# Patient Record
Sex: Female | Born: 1990 | Race: Black or African American | Hispanic: No | Marital: Married | Smoking: Never smoker
Health system: Southern US, Community
[De-identification: ages and names within clinical notes are randomized; demographics above are authoritative.]

---

## 2013-10-27 ENCOUNTER — Emergency Department (HOSPITAL_COMMUNITY)
Admission: EM | Admit: 2013-10-27 | Discharge: 2013-10-27 | Disposition: A | Discharge status: Home / Self Care | Payer: Managed Care, Other (non HMO) | Attending: Emergency Medicine | Admitting: Emergency Medicine

## 2013-10-27 ENCOUNTER — Emergency Department (HOSPITAL_COMMUNITY): Payer: Managed Care, Other (non HMO)

## 2013-10-27 ENCOUNTER — Encounter (HOSPITAL_COMMUNITY): Payer: Self-pay | Admitting: Emergency Medicine

## 2013-10-27 DIAGNOSIS — R109 Unspecified abdominal pain: Secondary | ICD-10-CM

## 2013-10-27 DIAGNOSIS — K5900 Constipation, unspecified: Secondary | ICD-10-CM | POA: Insufficient documentation

## 2013-10-27 DIAGNOSIS — Z79899 Other long term (current) drug therapy: Secondary | ICD-10-CM | POA: Insufficient documentation

## 2013-10-27 DIAGNOSIS — Z7982 Long term (current) use of aspirin: Secondary | ICD-10-CM | POA: Insufficient documentation

## 2013-10-27 DIAGNOSIS — K644 Residual hemorrhoidal skin tags: Secondary | ICD-10-CM | POA: Insufficient documentation

## 2013-10-27 DIAGNOSIS — Z3202 Encounter for pregnancy test, result negative: Secondary | ICD-10-CM | POA: Insufficient documentation

## 2013-10-27 LAB — CBC WITH DIFFERENTIAL/PLATELET
BASOS PCT: 0 % (ref 0–1)
Basophils Absolute: 0 10*3/uL (ref 0.0–0.1)
EOS ABS: 0.2 10*3/uL (ref 0.0–0.7)
Eosinophils Relative: 4 % (ref 0–5)
HCT: 37.2 % (ref 36.0–46.0)
Hemoglobin: 12.5 g/dL (ref 12.0–15.0)
LYMPHS ABS: 1.2 10*3/uL (ref 0.7–4.0)
Lymphocytes Relative: 27 % (ref 12–46)
MCH: 28.3 pg (ref 26.0–34.0)
MCHC: 33.6 g/dL (ref 30.0–36.0)
MCV: 84.2 fL (ref 78.0–100.0)
MONOS PCT: 8 % (ref 3–12)
Monocytes Absolute: 0.3 10*3/uL (ref 0.1–1.0)
NEUTROS ABS: 2.8 10*3/uL (ref 1.7–7.7)
NEUTROS PCT: 61 % (ref 43–77)
PLATELETS: 263 10*3/uL (ref 150–400)
RBC: 4.42 MIL/uL (ref 3.87–5.11)
RDW: 12.3 % (ref 11.5–15.5)
WBC: 4.6 10*3/uL (ref 4.0–10.5)

## 2013-10-27 LAB — POC URINE PREG, ED: Preg Test, Ur: NEGATIVE

## 2013-10-27 LAB — COMPREHENSIVE METABOLIC PANEL
ALK PHOS: 43 U/L (ref 39–117)
ALT: 13 U/L (ref 0–35)
AST: 16 U/L (ref 0–37)
Albumin: 3.4 g/dL — ABNORMAL LOW (ref 3.5–5.2)
BUN: 8 mg/dL (ref 6–23)
CHLORIDE: 102 meq/L (ref 96–112)
CO2: 26 mEq/L (ref 19–32)
Calcium: 8.9 mg/dL (ref 8.4–10.5)
Creatinine, Ser: 0.79 mg/dL (ref 0.50–1.10)
GFR calc Af Amer: >90 mL/min (ref >90)
GFR calc non Af Amer: >90 mL/min (ref >90)
Glucose, Bld: 101 mg/dL — ABNORMAL HIGH (ref 70–99)
POTASSIUM: 3.6 meq/L — AB (ref 3.7–5.3)
SODIUM: 138 meq/L (ref 137–147)
Total Protein: 7.5 g/dL (ref 6.0–8.3)

## 2013-10-27 LAB — URINALYSIS, ROUTINE W REFLEX MICROSCOPIC
BILIRUBIN URINE: NEGATIVE
Glucose, UA: NEGATIVE mg/dL
HGB URINE DIPSTICK: NEGATIVE
Ketones, ur: NEGATIVE mg/dL
Nitrite: NEGATIVE
PH: 7 (ref 5.0–8.0)
Protein, ur: NEGATIVE mg/dL
Specific Gravity, Urine: 1.026 (ref 1.005–1.030)
Urobilinogen, UA: 1 mg/dL (ref 0.0–1.0)

## 2013-10-27 LAB — URINE MICROSCOPIC-ADD ON

## 2013-10-27 LAB — LIPASE, BLOOD: Lipase: 24 U/L (ref 11–59)

## 2013-10-27 MED ORDER — FLEET ENEMA 7-19 GM/118ML RE ENEM: 1.0000 | ENEMA | Freq: Every day | RECTAL | Status: DC | PRN

## 2013-10-27 MED ORDER — KETOROLAC TROMETHAMINE 15 MG/ML IJ SOLN: 15.0000 mg | Freq: Once | INTRAMUSCULAR | Status: DC

## 2013-10-27 MED ORDER — HYDROCODONE-ACETAMINOPHEN 5-325 MG PO TABS
1.0000 | ORAL_TABLET | ORAL | Status: DC | PRN
Start: 2013-10-27 — End: 2013-11-16

## 2013-10-27 MED ORDER — KETOROLAC TROMETHAMINE 30 MG/ML IJ SOLN
30.0000 mg | Freq: Once | INTRAMUSCULAR | Status: AC
  Administered 2013-10-27: 30 mg via INTRAMUSCULAR
  Filled 2013-10-27: qty 1

## 2013-10-27 MED ORDER — POLYETHYLENE GLYCOL 3350 17 GM/SCOOP PO POWD: 17.0000 g | Freq: Two times a day (BID) | ORAL | Status: DC

## 2013-10-27 NOTE — ED Provider Notes (Signed)
CSN: [id number]     Arrival date & time 10/27/13  1237 History   First MD Initiated Contact with Patient 10/27/13 1458     Chief Complaint  Patient presents with  . Abdominal Pain     (Consider location/radiation/quality/duration/timing/severity/associated sxs/prior Treatment) HPI Comments: The patient is a 23 year old female, presenting with abdominal pain since 0500 today. The patient describes discomfort as sharp, constant. Worsened by movement. Relief by resting.  She reports associated constipation, last p.m. 2-3 days ago. She reports recent recurrent constipation over the past one month, with a bowel movement every 2-3 days. She denies BRBPR, pus. Denies abdominal surgeries. Denies urinary symptoms, abnormal vaginal discharge, or or abnormal vaginal bleeding. Patient's last menstrual period was 10/06/2013. No recent antibiotic use, travel, or change in diet.    Patient is a 23 y.o. female presenting with abdominal pain. The history is provided by the patient. No language interpreter was used.  Abdominal Pain Associated symptoms: constipation   Associated symptoms: no chills, no diarrhea, no dysuria, no fever, no hematuria, no nausea, no vaginal bleeding and no vaginal discharge     History reviewed. No pertinent past medical history. History reviewed. No pertinent past surgical history. No family history on file. History  Substance Use Topics  . Smoking status: Never Smoker   . Smokeless tobacco: Never Used  . Alcohol Use: No   OB History   Grav Para Term Preterm Abortions TAB SAB Ect Mult Living                 Review of Systems  Constitutional: Negative for fever and chills.  Gastrointestinal: Positive for abdominal pain and constipation. Negative for nausea, diarrhea, blood in stool and anal bleeding.  Genitourinary: Negative for dysuria, urgency, frequency, hematuria, vaginal bleeding and vaginal discharge.      Allergies  Peanut-containing drug products  Home  Medications   Prior to Admission medications   Medication Sig Start Date End Date Taking? Authorizing Provider  aspirin 325 MG tablet Take 325 mg by mouth every 6 (six) hours as needed for headache.   Yes Historical Provider, MD  BIOTIN PO Take 1 tablet by mouth daily.   Yes Historical Provider, MD  diphenhydrAMINE (ALLERGY RELIEF) 25 MG tablet Take 25 mg by mouth every 6 (six) hours as needed for allergies.   Yes Historical Provider, MD  Levonorgestrel-Ethinyl Estradiol (CAMRESE) 0.15-0.03 &0.01 MG tablet Take 1 tablet by mouth every morning.   Yes Historical Provider, MD   BP 105/73  Pulse 73  Temp(Src) 98.3 F (36.8 C) (Oral)  Resp 17  SpO2 100%  LMP 10/06/2013 Physical Exam  Nursing note and vitals reviewed. Constitutional: She is oriented to person, place, and time. She appears well-developed and well-nourished. No distress.  HENT:  Head: Normocephalic and atraumatic.  Eyes: EOM are normal. Pupils are equal, round, and reactive to light. No scleral icterus.  Neck: Neck supple.  Cardiovascular: Normal rate, regular rhythm and normal heart sounds.   No murmur heard. Pulmonary/Chest: Effort normal and breath sounds normal. She has no wheezes.  Abdominal: Soft. Normal appearance and bowel sounds are normal. She exhibits no distension. There is tenderness in the epigastric area. There is guarding. There is no rebound, no CVA tenderness and negative Murphy's sign.  Mild-Moderate epigastric tenderness to palpation. Mild RLQ tenderness  Genitourinary:  No stool in rectal vault, no obvious blood on glove, minimal amount of tan stool on glove. Mild tenderness, small external hemorrhoid. Chaperone present.  Musculoskeletal: Normal range of  motion. She exhibits no edema.  Neurological: She is alert and oriented to person, place, and time.  Skin: Skin is warm and dry. No rash noted.  Psychiatric: She has a normal mood and affect. Her behavior is normal.    ED Course  Procedures  (including critical care time) Labs Review Labs Reviewed  COMPREHENSIVE METABOLIC PANEL - Abnormal; Notable for the following:    Potassium 3.6 (*)    Glucose, Bld 101 (*)    Albumin 3.4 (*)    Total Bilirubin <0.2 (*)    All other components within normal limits  URINALYSIS, ROUTINE W REFLEX MICROSCOPIC - Abnormal; Notable for the following:    APPearance CLOUDY (*)    Leukocytes, UA SMALL (*)    All other components within normal limits  URINE MICROSCOPIC-ADD ON - Abnormal; Notable for the following:    Squamous Epithelial / LPF FEW (*)    Bacteria, UA FEW (*)    All other components within normal limits  CBC WITH DIFFERENTIAL  LIPASE, BLOOD  POC URINE PREG, ED    Imaging Review Dg Abd 2 Views  10/27/2013   CLINICAL DATA:  Umbilical abdominal pain, abdominal distention  EXAM: ABDOMEN - 2 VIEW  COMPARISON:  None.  FINDINGS: The bowel gas pattern is normal. There is no evidence of free air. No radio-opaque calculi or other significant radiographic abnormality is seen.  IMPRESSION: Negative.   Electronically Signed   By: Christiana PellantGretchen  Green M.D.   On: 10/27/2013 15:53     EKG Interpretation None      MDM   Final diagnoses:  Abdominal pain  Constipation   Patient with generalized abdominal pain, last bowel movement was several days ago. Likely constipation. Normal abdominal x-ray, no signs of obstruction. Rectal exam, negative for impaction. CBC without psychosis, CMP without concerning abnormality. Negative lipase. UA small leukocytosis, she bacteria, squamous likely contaminate. Epigastric discomfort questionable PUD, denies history will have the patient follow up with PCP and GI for further evaluation of her abdominal discomfort and constipation over the past month. Reeval patient resting comfortably in room reports symptom improvement.  Discussed lab results, imaging results, and treatment plan with the patient. Return precautions given. Reports understanding and no other  concerns at this time.  Patient is stable for discharge at this time.  . Meds given in ED:  Medications  ketorolac (TORADOL) 30 MG/ML injection 30 mg (30 mg Intramuscular Given 10/27/13 1600)    Discharge Medication List as of 10/27/2013  4:10 PM    START taking these medications   Details  HYDROcodone-acetaminophen (NORCO/VICODIN) 5-325 MG per tablet Take 1 tablet by mouth every 4 (four) hours as needed. Take with food and plenty of fluid, Starting 10/27/2013, Until Discontinued, Print    polyethylene glycol powder (GLYCOLAX/MIRALAX) powder Take 17 g by mouth 2 (two) times daily. Until daily soft stools  OTC, Starting 10/27/2013, Until Discontinued, Print    sodium phosphate (FLEET) 7-19 GM/118ML ENEM Place 133 mLs (1 enema total) rectally daily as needed for severe constipation., Starting 10/27/2013, Until Discontinued, Print          Clabe SealLauren M Rubee Vega, PA-C 10/29/13 [phone]

## 2013-10-27 NOTE — Discharge Instructions (Signed)
Call for a follow up appointment with a Family or Primary Care Provider.  Call for an appointment with a gastroenterologist for further evaluation of her ongoing constipation. Return if Symptoms worsen.   Take medication as prescribed.  Drink plenty fluids, increase your dietary fiber.

## 2013-10-27 NOTE — ED Notes (Signed)
Pt states that she woke up with abd pain around 5am went on to work hoping it would get better and her boss sent her home bc of the pain.  Pt denies n/v/d  Pt states last BM was 2 days ago and was hard and had to strain more than her normal. Pt also states that her abd is more distended than normal.

## 2013-10-31 NOTE — ED Provider Notes (Signed)
Medical screening examination/treatment/procedure(s) were performed by non-physician practitioner and as supervising physician I was immediately available for consultation/collaboration.   EKG Interpretation None       Vanda Waskey R. Dayami Taitt, MD 10/31/13 1457 

## 2013-11-12 ENCOUNTER — Emergency Department (HOSPITAL_COMMUNITY)
Admission: EM | Admit: 2013-11-12 | Discharge: 2013-11-12 | Discharge status: Against Medical Advice | Payer: Managed Care, Other (non HMO)

## 2013-11-12 NOTE — ED Notes (Signed)
Pt seen walking out of the emergency room lobby with family member

## 2013-11-12 NOTE — ED Notes (Signed)
Pt did not return to emergency room lobby

## 2013-11-16 ENCOUNTER — Encounter (HOSPITAL_COMMUNITY): Payer: Self-pay | Admitting: Emergency Medicine

## 2013-11-16 ENCOUNTER — Emergency Department (HOSPITAL_COMMUNITY)
Admission: EM | Admit: 2013-11-16 | Discharge: 2013-11-16 | Disposition: A | Discharge status: Home / Self Care | Payer: Managed Care, Other (non HMO) | Attending: Emergency Medicine | Admitting: Emergency Medicine

## 2013-11-16 DIAGNOSIS — Z3202 Encounter for pregnancy test, result negative: Secondary | ICD-10-CM | POA: Insufficient documentation

## 2013-11-16 DIAGNOSIS — Z79899 Other long term (current) drug therapy: Secondary | ICD-10-CM | POA: Insufficient documentation

## 2013-11-16 DIAGNOSIS — N898 Other specified noninflammatory disorders of vagina: Secondary | ICD-10-CM

## 2013-11-16 DIAGNOSIS — R109 Unspecified abdominal pain: Secondary | ICD-10-CM

## 2013-11-16 LAB — WET PREP, GENITAL
Trich, Wet Prep: NONE SEEN
Yeast Wet Prep HPF POC: NONE SEEN

## 2013-11-16 LAB — URINE MICROSCOPIC-ADD ON

## 2013-11-16 LAB — URINALYSIS, ROUTINE W REFLEX MICROSCOPIC
Bilirubin Urine: NEGATIVE
Glucose, UA: NEGATIVE mg/dL
Hgb urine dipstick: NEGATIVE
KETONES UR: NEGATIVE mg/dL
NITRITE: NEGATIVE
Protein, ur: NEGATIVE mg/dL
SPECIFIC GRAVITY, URINE: 1.024 (ref 1.005–1.030)
Urobilinogen, UA: 1 mg/dL (ref 0.0–1.0)
pH: 6.5 (ref 5.0–8.0)

## 2013-11-16 LAB — POC URINE PREG, ED: PREG TEST UR: NEGATIVE

## 2013-11-16 MED ORDER — METRONIDAZOLE 500 MG PO TABS: 500.0000 mg | ORAL_TABLET | Freq: Two times a day (BID) | ORAL | Status: DC

## 2013-11-16 NOTE — ED Provider Notes (Signed)
CSN: [id number]     Arrival date & time 11/16/13  1542 History   First MD Initiated Contact with Patient 11/16/13 1609     Chief Complaint  Patient presents with  . Flank Pain  . Vaginal Discharge     (Consider location/radiation/quality/duration/timing/severity/associated sxs/prior Treatment) HPI 23 year old female presents with left flank pain for over 1 week. She states it comes and goes. It is worse by walking and twisting. She does not recall any specific injuries. Her family stated that it could be a UTI she presented to the ER for evaluation. She denies any specific frequency, urgency, or dysuria. No hematuria. The pain seems to come on for about an hour and then go away. Denies any nausea or abdominal pain. She's never had pain like this before. Never had any prior history of kidney stones. She's also been having vaginal discharge with a mild odor over the last week as well. She currently rates the pain as about a 2/10.  History reviewed. No pertinent past medical history. History reviewed. No pertinent past surgical history. No family history on file. History  Substance Use Topics  . Smoking status: Never Smoker   . Smokeless tobacco: Never Used  . Alcohol Use: No   OB History   Grav Para Term Preterm Abortions TAB SAB Ect Mult Living                 Review of Systems  Constitutional: Negative for fever.  Respiratory: Negative for cough.   Gastrointestinal: Negative for nausea, vomiting, abdominal pain and diarrhea.  Genitourinary: Positive for flank pain and vaginal discharge. Negative for dysuria, hematuria, vaginal bleeding, difficulty urinating and menstrual problem.  Musculoskeletal: Negative for back pain.  All other systems reviewed and are negative.     Allergies  Peanut-containing drug products  Home Medications   Prior to Admission medications   Medication Sig Start Date End Date Taking? Authorizing Provider  BIOTIN PO Take 1 tablet by mouth daily.    Yes Historical Provider, MD  diphenhydrAMINE (ALLERGY RELIEF) 25 MG tablet Take 25 mg by mouth every 6 (six) hours as needed for allergies.   Yes Historical Provider, MD  Levonorgestrel-Ethinyl Estradiol (CAMRESE) 0.15-0.03 &0.01 MG tablet Take 1 tablet by mouth every morning.   Yes Historical Provider, MD   BP 119/70  Pulse 87  Temp(Src) 99.2 F (37.3 C) (Oral)  Resp 20  SpO2 97%  LMP 09/29/2013 Physical Exam  Nursing note and vitals reviewed. Constitutional: She is oriented to person, place, and time. She appears well-developed and well-nourished.  HENT:  Head: Normocephalic and atraumatic.  Right Ear: External ear normal.  Left Ear: External ear normal.  Nose: Nose normal.  Eyes: Right eye exhibits no discharge. Left eye exhibits no discharge.  Cardiovascular: Normal rate, regular rhythm and normal heart sounds.   Pulmonary/Chest: Effort normal and breath sounds normal.  Abdominal: Soft. She exhibits no distension. There is no tenderness. There is no CVA tenderness.    Genitourinary: Uterus is not enlarged and not tender. Cervix exhibits no motion tenderness and no friability. Right adnexum displays no mass. Left adnexum displays no mass. No tenderness around the vagina. Vaginal discharge found.  Neurological: She is alert and oriented to person, place, and time.  Skin: Skin is warm and dry.    ED Course  Procedures (including critical care time) Labs Review Labs Reviewed  WET PREP, GENITAL - Abnormal; Notable for the following:    Clue Cells Wet Prep HPF POC FEW (*)  WBC, Wet Prep HPF POC FEW (*)    All other components within normal limits  URINALYSIS, ROUTINE W REFLEX MICROSCOPIC - Abnormal; Notable for the following:    APPearance CLOUDY (*)    Leukocytes, UA SMALL (*)    All other components within normal limits  URINE MICROSCOPIC-ADD ON - Abnormal; Notable for the following:    Squamous Epithelial / LPF FEW (*)    All other components within normal limits    URINE CULTURE  GC/CHLAMYDIA PROBE AMP  POC URINE PREG, ED    Imaging Review No results found.   EKG Interpretation None      MDM   Final diagnoses:  Vaginal discharge  Left flank pain    No signs of urinary tract infection. The patient's flank pain is atypical she has point tenderness nearly one spot on her left flank. I have low suspicion for this being renal colic. She does have mild to moderate vaginal discharge with a few clue cells. This will cover with Flagyl to see if this improves her discharge and possibly or flank pain. We'll have her followup with her PCP and/or her gynecologist. At this time I have low suspicion for serious acute pathology causing her flank symptoms. Discussed return precautions and she is stable for discharge.    Audree CamelScott T Axcel Horsch, MD 11/16/13 [phone]

## 2013-11-16 NOTE — ED Notes (Signed)
Pt c/o L side flank pain for about a week. Pt denies dysuria. Pt also c/o vaginal discharge with odor for about a week and a half.

## 2013-11-17 LAB — GC/CHLAMYDIA PROBE AMP
CT Probe RNA: NEGATIVE
GC Probe RNA: NEGATIVE

## 2013-11-17 LAB — URINE CULTURE: Colony Count: 30000

## 2013-12-02 ENCOUNTER — Encounter (HOSPITAL_COMMUNITY): Payer: Self-pay | Admitting: Emergency Medicine

## 2013-12-02 ENCOUNTER — Emergency Department (HOSPITAL_COMMUNITY)
Admission: EM | Admit: 2013-12-02 | Discharge: 2013-12-02 | Disposition: A | Discharge status: Home / Self Care | Payer: Managed Care, Other (non HMO) | Attending: Emergency Medicine | Admitting: Emergency Medicine

## 2013-12-02 DIAGNOSIS — Z79899 Other long term (current) drug therapy: Secondary | ICD-10-CM | POA: Insufficient documentation

## 2013-12-02 DIAGNOSIS — N898 Other specified noninflammatory disorders of vagina: Secondary | ICD-10-CM | POA: Insufficient documentation

## 2013-12-02 DIAGNOSIS — Z792 Long term (current) use of antibiotics: Secondary | ICD-10-CM | POA: Insufficient documentation

## 2013-12-02 DIAGNOSIS — Z3202 Encounter for pregnancy test, result negative: Secondary | ICD-10-CM | POA: Insufficient documentation

## 2013-12-02 DIAGNOSIS — IMO0002 Reserved for concepts with insufficient information to code with codable children: Secondary | ICD-10-CM | POA: Insufficient documentation

## 2013-12-02 LAB — URINALYSIS, ROUTINE W REFLEX MICROSCOPIC
Bilirubin Urine: NEGATIVE
GLUCOSE, UA: NEGATIVE mg/dL
Hgb urine dipstick: NEGATIVE
Ketones, ur: NEGATIVE mg/dL
Nitrite: NEGATIVE
PROTEIN: NEGATIVE mg/dL
SPECIFIC GRAVITY, URINE: 1.023 (ref 1.005–1.030)
UROBILINOGEN UA: 0.2 mg/dL (ref 0.0–1.0)
pH: 7 (ref 5.0–8.0)

## 2013-12-02 LAB — URINE MICROSCOPIC-ADD ON

## 2013-12-02 LAB — HIV ANTIBODY (ROUTINE TESTING W REFLEX): HIV 1&2 Ab, 4th Generation: NONREACTIVE

## 2013-12-02 LAB — POC URINE PREG, ED: PREG TEST UR: NEGATIVE

## 2013-12-02 LAB — RPR

## 2013-12-02 MED ORDER — CLOTRIMAZOLE-BETAMETHASONE 1-0.05 % EX CREA: TOPICAL_CREAM | CUTANEOUS | Status: DC

## 2013-12-02 NOTE — ED Notes (Signed)
Pt states that on the outside of her vagina she has rash like irrtation that hurts for about a month. Pt states that she was treated for bacteria vaginosis while back.

## 2013-12-02 NOTE — ED Provider Notes (Signed)
CSN: [id number]     Arrival date & time 12/02/13  1108 History   First MD Initiated Contact with Patient 12/02/13 1213     Chief Complaint  Patient presents with  . vaginal irriation      (Consider location/radiation/quality/duration/timing/severity/associated sxs/prior Treatment) HPI Comments: Patient presents to the ER for evaluation of vaginal irritation. Patient reports that she has redness, swelling and irritation around the area asked to her vagina. She reports that she was seen a couple of weeks ago for discharge and flank pain, treated for bacterial vaginosis with improvement of her discharge.    History reviewed. No pertinent past medical history. History reviewed. No pertinent past surgical history. No family history on file. History  Substance Use Topics  . Smoking status: Never Smoker   . Smokeless tobacco: Never Used  . Alcohol Use: No   OB History   Grav Para Term Preterm Abortions TAB SAB Ect Mult Living                 Review of Systems  Genitourinary: Positive for vaginal discharge and vaginal pain.  All other systems reviewed and are negative.     Allergies  Peanut-containing drug products  Home Medications   Prior to Admission medications   Medication Sig Start Date End Date Taking? Authorizing Provider  diphenhydrAMINE (ALLERGY RELIEF) 25 MG tablet Take 25 mg by mouth every 6 (six) hours as needed for allergies.   Yes Historical Provider, MD  Levonorgestrel-Ethinyl Estradiol (CAMRESE) 0.15-0.03 &0.01 MG tablet Take 1 tablet by mouth every morning.   Yes Historical Provider, MD  clotrimazole-betamethasone (LOTRISONE) cream Apply to affected area 2 times daily prn 12/02/13   Gilda Creasehristopher J. Pollina, MD  metroNIDAZOLE (FLAGYL) 500 MG tablet Take 500 mg by mouth 2 (two) times daily.    Historical Provider, MD   BP 109/65  Pulse 67  Temp(Src) 98.8 F (37.1 C) (Oral)  Resp 16  SpO2 100%  LMP 09/29/2013 Physical Exam  Constitutional: She is oriented to  person, place, and time. She appears well-developed and well-nourished. No distress.  HENT:  Head: Normocephalic and atraumatic.  Right Ear: Hearing normal.  Left Ear: Hearing normal.  Nose: Nose normal.  Mouth/Throat: Oropharynx is clear and moist and mucous membranes are normal.  Eyes: Conjunctivae and EOM are normal. Pupils are equal, round, and reactive to light.  Neck: Normal range of motion. Neck supple.  Cardiovascular: Regular rhythm, S1 normal and S2 normal.  Exam reveals no gallop and no friction rub.   No murmur heard. Pulmonary/Chest: Effort normal and breath sounds normal. No respiratory distress. She exhibits no tenderness.  Abdominal: Soft. Normal appearance and bowel sounds are normal. There is no hepatosplenomegaly. There is no tenderness. There is no rebound, no guarding, no tenderness at McBurney's point and negative Murphy's sign. No hernia.  Genitourinary: Uterus normal.    Cervix exhibits no motion tenderness, no discharge and no friability. Right adnexum displays no mass and no tenderness. Left adnexum displays no mass and no tenderness.  Musculoskeletal: Normal range of motion.  Neurological: She is alert and oriented to person, place, and time. She has normal strength. No cranial nerve deficit or sensory deficit. Coordination normal. GCS eye subscore is 4. GCS verbal subscore is 5. GCS motor subscore is 6.  Skin: Skin is warm, dry and intact. No rash noted. No cyanosis.  Psychiatric: She has a normal mood and affect. Her speech is normal and behavior is normal. Thought content normal.    ED  Course  Procedures (including critical care time) Labs Review Labs Reviewed  URINALYSIS, ROUTINE W REFLEX MICROSCOPIC - Abnormal; Notable for the following:    Leukocytes, UA SMALL (*)    All other components within normal limits  URINE MICROSCOPIC-ADD ON - Abnormal; Notable for the following:    Bacteria, UA FEW (*)    All other components within normal limits    GC/CHLAMYDIA PROBE AMP  RPR  HIV ANTIBODY (ROUTINE TESTING)  POC URINE PREG, ED    Imaging Review No results found.   EKG Interpretation None      MDM   Final diagnoses:  Vaginal irritation   Presents to the ER for evaluation irritation on the outside portions of her vagina. There is mild erythema without any other noted lesions. Internal examination was normal. Will treat externally with Lotrisone, followup with OB/GYN.    Gilda Creasehristopher J. Pollina, MD 12/02/13 1339

## 2013-12-02 NOTE — Discharge Instructions (Signed)
Monilial Vaginitis  Vaginitis in a soreness, swelling and redness (inflammation) of the vagina and vulva. Monilial vaginitis is not a sexually transmitted infection.  CAUSES   Yeast vaginitis is caused by yeast (candida) that is normally found in your vagina. With a yeast infection, the candida has overgrown in number to a point that upsets the chemical balance.  SYMPTOMS   · White, thick vaginal discharge.  · Swelling, itching, redness and irritation of the vagina and possibly the lips of the vagina (vulva).  · Burning or painful urination.  · Painful intercourse.  DIAGNOSIS   Things that may contribute to monilial vaginitis are:  · Postmenopausal and virginal states.  · Pregnancy.  · Infections.  · Being tired, sick or stressed, especially if you had monilial vaginitis in the past.  · Diabetes. Good control will help lower the chance.  · Birth control pills.  · Tight fitting garments.  · Using bubble bath, feminine sprays, douches or deodorant tampons.  · Taking certain medications that kill germs (antibiotics).  · Sporadic recurrence can occur if you become ill.  TREATMENT   Your caregiver will give you medication.  · There are several kinds of anti monilial vaginal creams and suppositories specific for monilial vaginitis. For recurrent yeast infections, use a suppository or cream in the vagina 2 times a week, or as directed.  · Anti-monilial or steroid cream for the itching or irritation of the vulva may also be used. Get your caregiver's permission.  · Painting the vagina with methylene blue solution may help if the monilial cream does not work.  · Eating yogurt may help prevent monilial vaginitis.  HOME CARE INSTRUCTIONS   · Finish all medication as prescribed.  · Do not have sex until treatment is completed or after your caregiver tells you it is okay.  · Take warm sitz baths.  · Do not douche.  · Do not use tampons, especially scented ones.  · Wear cotton underwear.  · Avoid tight pants and panty  hose.  · Tell your sexual partner that you have a yeast infection. They should go to their caregiver if they have symptoms such as mild rash or itching.  · Your sexual partner should be treated as well if your infection is difficult to eliminate.  · Practice safer sex. Use condoms.  · Some vaginal medications cause latex condoms to fail. Vaginal medications that harm condoms are:  · Cleocin cream.  · Butoconazole (Femstat®).  · Terconazole (Terazol®) vaginal suppository.  · Miconazole (Monistat®) (may be purchased over the counter).  SEEK MEDICAL CARE IF:   · You have a temperature by mouth above 102° F (38.9° C).  · The infection is getting worse after 2 days of treatment.  · The infection is not getting better after 3 days of treatment.  · You develop blisters in or around your vagina.  · You develop vaginal bleeding, and it is not your menstrual period.  · You have pain when you urinate.  · You develop intestinal problems.  · You have pain with sexual intercourse.  Document Released: 03/15/2005 Document Revised: 08/28/2011 Document Reviewed: 11/27/2008  [employer]® Patient Information ©2014 [employer].

## 2013-12-03 LAB — GC/CHLAMYDIA PROBE AMP
CT Probe RNA: NEGATIVE
GC Probe RNA: NEGATIVE

## 2014-03-16 ENCOUNTER — Emergency Department (HOSPITAL_COMMUNITY)
Admission: EM | Admit: 2014-03-16 | Discharge: 2014-03-16 | Disposition: A | Discharge status: Home / Self Care | Payer: Managed Care, Other (non HMO) | Attending: Emergency Medicine | Admitting: Emergency Medicine

## 2014-03-16 ENCOUNTER — Encounter (HOSPITAL_COMMUNITY): Payer: Self-pay | Admitting: Emergency Medicine

## 2014-03-16 DIAGNOSIS — Z792 Long term (current) use of antibiotics: Secondary | ICD-10-CM | POA: Diagnosis not present

## 2014-03-16 DIAGNOSIS — IMO0002 Reserved for concepts with insufficient information to code with codable children: Secondary | ICD-10-CM | POA: Diagnosis not present

## 2014-03-16 DIAGNOSIS — J029 Acute pharyngitis, unspecified: Secondary | ICD-10-CM | POA: Diagnosis present

## 2014-03-16 DIAGNOSIS — J020 Streptococcal pharyngitis: Secondary | ICD-10-CM | POA: Insufficient documentation

## 2014-03-16 DIAGNOSIS — H6120 Impacted cerumen, unspecified ear: Secondary | ICD-10-CM | POA: Insufficient documentation

## 2014-03-16 LAB — RAPID STREP SCREEN (MED CTR MEBANE ONLY): STREPTOCOCCUS, GROUP A SCREEN (DIRECT): POSITIVE — AB

## 2014-03-16 MED ORDER — ACETAMINOPHEN-CODEINE #3 300-30 MG PO TABS: 1.0000 | ORAL_TABLET | Freq: Four times a day (QID) | ORAL | Status: DC | PRN

## 2014-03-16 MED ORDER — NAPROXEN 500 MG PO TABS: 500.0000 mg | ORAL_TABLET | Freq: Two times a day (BID) | ORAL | Status: DC | PRN

## 2014-03-16 MED ORDER — PENICILLIN G BENZATHINE 1200000 UNIT/2ML IM SUSP
1.2000 10*6.[IU] | Freq: Once | INTRAMUSCULAR | Status: AC
  Administered 2014-03-16: 1.2 10*6.[IU] via INTRAMUSCULAR
  Filled 2014-03-16: qty 2

## 2014-03-16 NOTE — ED Provider Notes (Signed)
CSN: [id number]     Arrival date & time 03/16/14  1539 History  This chart was scribed for non-physician practitioner, Allen Derry, PA-C,working with Mirian Mo, MD, by Karle Plumber, ED Scribe. This patient was seen in room WTR8/WTR8 and the patient's care was started at 5:35 PM.  Chief Complaint  Patient presents with  . Sore Throat   Patient is a 23 y.o. female presenting with pharyngitis. The history is provided by the patient. No language interpreter was used.  Sore Throat This is a new problem. The current episode started 2 days ago. The problem occurs constantly. The problem has not changed since onset.Associated symptoms include headaches. Pertinent negatives include no chest pain, no abdominal pain and no shortness of breath. The symptoms are aggravated by swallowing. She has tried heat for the symptoms. The treatment provided moderate relief.  Sore Throat This is a new problem. The current episode started 2 days ago. The problem occurs constantly. The problem has not changed since onset.Associated symptoms include congestion, headaches and a sore throat. Pertinent negatives include no abdominal pain, arthralgias, chest pain, chills, coughing, fever, myalgias, nausea, neck pain, numbness, rash, swollen glands, vertigo, vomiting or weakness. The symptoms are aggravated by swallowing. She has tried heat for the symptoms. The treatment provided moderate relief.   HPI Comments:  Julie Mason is a 23 y.o. female who presents to the Emergency Department complaining of a severe, sharp R sided sore throat that began two days ago. Pt states she feels a lump on the right side of her throat. She reports associated rhinorrhea and congestion-related mild intermittent HA. She reports opening her mouth and swallowing makes her pain worse. Hot liquids help alleviate the pain. She has not taken any medications for her symptoms. Pt states that she has had contact with someone at work that was  diagnosed with strep. She denies fever, chills, otalgia, drooling, inability to swallow, dental pain, CP, SOB, cough, abdominal pain, nausea, vomiting or myalgias. Pt does not have a PCP except for her OB/GYN.  History reviewed. No pertinent past medical history. History reviewed. No pertinent past surgical history. No family history on file. History  Substance Use Topics  . Smoking status: Never Smoker   . Smokeless tobacco: Never Used  . Alcohol Use: No   OB History   Grav Para Term Preterm Abortions TAB SAB Ect Mult Living                 Review of Systems  Constitutional: Negative for fever and chills.  HENT: Positive for congestion, rhinorrhea, sinus pressure and sore throat. Negative for dental problem, drooling, ear discharge, ear pain, nosebleeds, postnasal drip, sneezing, tinnitus and trouble swallowing.   Eyes: Negative for pain.  Respiratory: Negative for cough, shortness of breath and wheezing.   Cardiovascular: Negative for chest pain.  Gastrointestinal: Negative for nausea, vomiting and abdominal pain.  Musculoskeletal: Negative for arthralgias, myalgias and neck pain.  Skin: Negative for rash.  Neurological: Positive for headaches. Negative for dizziness, vertigo, weakness, light-headedness and numbness.   A complete 10 system review of systems was obtained and all systems are negative except as noted in the HPI and PMH.   Allergies  Peanut-containing drug products  Home Medications   Prior to Admission medications   Medication Sig Start Date End Date Taking? Authorizing Provider  clotrimazole-betamethasone (LOTRISONE) cream Apply to affected area 2 times daily prn 12/02/13   Gilda Crease, MD  diphenhydrAMINE (ALLERGY RELIEF) 25 MG tablet Take 25 mg  by mouth every 6 (six) hours as needed for allergies.    Historical Provider, MD  Levonorgestrel-Ethinyl Estradiol (CAMRESE) 0.15-0.03 &0.01 MG tablet Take 1 tablet by mouth every morning.    Historical  Provider, MD  metroNIDAZOLE (FLAGYL) 500 MG tablet Take 500 mg by mouth 2 (two) times daily.    Historical Provider, MD   Triage Vitals: BP 103/67  Pulse 72  Temp(Src) 98.5 F (36.9 C) (Oral)  Resp 16  SpO2 98% Physical Exam  Nursing note and vitals reviewed. Constitutional: She is oriented to person, place, and time. Vital signs are normal. She appears well-developed and well-nourished.  Non-toxic appearance. No distress.  Afebrile, nontoxic, well appearing  HENT:  Head: Normocephalic and atraumatic.  Right Ear: Hearing and external ear normal. No drainage or swelling.  Left Ear: Hearing and external ear normal. No drainage or swelling.  Nose: Mucosal edema and rhinorrhea present.  Mouth/Throat: Uvula is midline, oropharynx is clear and moist and mucous membranes are normal. No trismus in the jaw. Normal dentition. No uvula swelling or dental caries. No oropharyngeal exudate, posterior oropharyngeal edema, posterior oropharyngeal erythema or tonsillar abscesses.  Cerumen impaction bilaterally and TMs not visualized. Nose with mild rhinorrhea, mucosal edema and erythema Oropharynx clear and moist, no erythema or exudates, no PTA or tonsillar swelling, no trismus or uvular deviation/swelling. MMM. No drooling  Eyes: Conjunctivae and EOM are normal. Pupils are equal, round, and reactive to light. Right eye exhibits no discharge. Left eye exhibits no discharge.  Neck: Normal range of motion. Neck supple.  Cardiovascular: Normal rate, regular rhythm, normal heart sounds and intact distal pulses.  Exam reveals no gallop and no friction rub.   No murmur heard. Pulmonary/Chest: Effort normal and breath sounds normal. No respiratory distress. She has no wheezes. She has no rales.  CTAB in all lung fields  Abdominal: Soft. Normal appearance and bowel sounds are normal. She exhibits no distension. There is no tenderness. There is no rigidity, no rebound and no guarding.  Musculoskeletal: Normal  range of motion.  Lymphadenopathy:       Head (right side): No submandibular and no tonsillar adenopathy present.       Head (left side): No submandibular and no tonsillar adenopathy present.    She has no cervical adenopathy.  No head/neck LAD  Neurological: She is alert and oriented to person, place, and time.  Skin: Skin is warm, dry and intact. No rash noted.  Psychiatric: She has a normal mood and affect. Her behavior is normal.    ED Course  Procedures (including critical care time) DIAGNOSTIC STUDIES: Oxygen Saturation is 98% on RA, normal by my interpretation.   COORDINATION OF CARE: 5:42 PM- Will treat for strep throat with antibiotics. Will give injection of PCN prior to discharge. Pt verbalizes understanding and agrees to plan.  Medications  penicillin g benzathine (BICILLIN LA) 1200000 UNIT/2ML injection 1.2 Million Units (not administered)    Labs Review Labs Reviewed  RAPID STREP SCREEN - Abnormal; Notable for the following:    Streptococcus, Group A Screen (Direct) POSITIVE (*)    All other components within normal limits    Imaging Review No results found.   EKG Interpretation None      MDM   Final diagnoses:  Strep pharyngitis    23y/o female with sore throat, unfortunately strep test was obtained in triage and was positive, therefore although low CENTOR criteria, given recent exposure to strep and pt insistance, will treat pt with bicillin today. Discussed  that this could still be false positive, and could just be viral illness. Pt tolerating secretions well without drooling, no PTA noted. Discussed symptom control. Pain meds given. Will have her f/up with PCP in 1wk for ongoing management. I explained the diagnosis and have given explicit precautions to return to the ER including for any other new or worsening symptoms. The patient understands and accepts the medical plan as it's been dictated and I have answered their questions. Discharge instructions  concerning home care and prescriptions have been given. The patient is STABLE and is discharged to home in good condition.   I personally performed the services described in this documentation, which was scribed in my presence. The recorded information has been reviewed and is accurate.  BP 103/67  Pulse 72  Temp(Src) 98.5 F (36.9 C) (Oral)  Resp 16  SpO2 98%  Meds ordered this encounter  Medications  . penicillin g benzathine (BICILLIN LA) 1200000 UNIT/2ML injection 1.2 Million Units    Sig:     Order Specific Question:  Antibiotic Indication:    Answer:  Pharyngitis  . acetaminophen-codeine (TYLENOL #3) 300-30 MG per tablet    Sig: Take 1 tablet by mouth every 6 (six) hours as needed for moderate pain.    Dispense:  20 tablet    Refill:  0    Order Specific Question:  Supervising Provider    Answer:  Eber Hong D [[id number]]  . naproxen (NAPROSYN) 500 MG tablet    Sig: Take 1 tablet (500 mg total) by mouth 2 (two) times daily as needed for mild pain, moderate pain or headache (TAKE WITH MEALS.).    Dispense:  20 tablet    Refill:  0    Order Specific Question:  Supervising Provider    Answer:  Vida Roller [address] [place], PA-C 03/16/14 [phone]

## 2014-03-16 NOTE — ED Notes (Signed)
Pt c/o sore throat over the past couple of days. Pt c/o "feels a lump when i swallow".  Pt denies fever.  Pt's coworker had strep throat.

## 2014-03-16 NOTE — Discharge Instructions (Signed)
Continue to stay well-hydrated. Gargle warm salt water and spit it out. Continue to alternate between Tylenol #3 and Naprosyn for pain or fever but don't drive while taking Tylenol #3. May consider over-the-counter Benadryl or antihistamine allergy medication for additional relief. You've been treated for strep throat based on your positive strep test, but beware that your illness could be a virus and could take up to two weeks to fully resolve. Followup with your primary care doctor in 5-7 days for recheck of ongoing symptoms. Return to emergency department for emergent changing or worsening of symptoms.   Pharyngitis Pharyngitis is a sore throat (pharynx). There is redness, pain, and swelling of your throat. HOME CARE   Drink enough fluids to keep your pee (urine) clear or pale yellow.  Only take medicine as told by your doctor.  You may get sick again if you do not take medicine as told. Finish your medicines, even if you start to feel better.  Do not take aspirin.  Rest.  Rinse your mouth (gargle) with salt water ( tsp of salt per 1 qt of water) every 1-2 hours. This will help the pain.  If you are not at risk for choking, you can suck on hard candy or sore throat lozenges. GET HELP IF:  You have large, tender lumps on your neck.  You have a rash.  You cough up green, yellow-brown, or bloody spit. GET HELP RIGHT AWAY IF:   You have a stiff neck.  You drool or cannot swallow liquids.  You throw up (vomit) or are not able to keep medicine or liquids down.  You have very bad pain that does not go away with medicine.  You have problems breathing (not from a stuffy nose). MAKE SURE YOU:   Understand these instructions.  Will watch your condition.  Will get help right away if you are not doing well or get worse. Document Released: 11/22/2007 Document Revised: 03/26/2013 Document Reviewed: 02/10/2013 [facility] Patient Information 2015 [place], [place]. This information is  not intended to replace advice given to you by your health care provider. Make sure you discuss any questions you have with your health care provider.  Salt Water Gargle This solution will help make your mouth and throat feel better. HOME CARE INSTRUCTIONS   Mix 1 teaspoon of salt in 8 ounces of warm water.  Gargle with this solution as much or often as you need or as directed. Swish and gargle gently if you have any sores or wounds in your mouth.  Do not swallow this mixture. Document Released: 03/09/2004 Document Revised: 08/28/2011 Document Reviewed: 07/31/2008 [facility] Patient Information 2015 [place], [place]. This information is not intended to replace advice given to you by your health care provider. Make sure you discuss any questions you have with your health care provider.  Strep Throat Strep throat is an infection of the throat caused by a bacteria named Streptococcus pyogenes. Your health care provider may call the infection streptococcal "tonsillitis" or "pharyngitis" depending on whether there are signs of inflammation in the tonsils or back of the throat. Strep throat is most common in children aged 5-15 years during the cold months of the year, but it can occur in people of any age during any season. This infection is spread from person to person (contagious) through coughing, sneezing, or other close contact. SIGNS AND SYMPTOMS   Fever or chills.  Painful, swollen, red tonsils or throat.  Pain or difficulty when swallowing.  White or yellow spots on  the tonsils or throat.  Swollen, tender lymph nodes or "glands" of the neck or under the jaw.  Red rash all over the body (rare). DIAGNOSIS  Many different infections can cause the same symptoms. A test must be done to confirm the diagnosis so the right treatment can be given. A "rapid strep test" can help your health care provider make the diagnosis in a few minutes. If this test is not available, a light swab of the  infected area can be used for a throat culture test. If a throat culture test is done, results are usually available in a day or two. TREATMENT  Strep throat is treated with antibiotic medicine. HOME CARE INSTRUCTIONS   Gargle with 1 tsp of salt in 1 cup of warm water, 3-4 times per day or as needed for comfort.  Family members who also have a sore throat or fever should be tested for strep throat and treated with antibiotics if they have the strep infection.  Make sure everyone in your household washes their hands well.  Do not share food, drinking cups, or personal items that could cause the infection to spread to others.  You may need to eat a soft food diet until your sore throat gets better.  Drink enough water and fluids to keep your urine clear or pale yellow. This will help prevent dehydration.  Get plenty of rest.  Stay home from school, day care, or work until you have been on antibiotics for 24 hours.  Take medicines only as directed by your health care provider.  Take your antibiotic medicine as directed by your health care provider. Finish it even if you start to feel better. SEEK MEDICAL CARE IF:   The glands in your neck continue to enlarge.  You develop a rash, cough, or earache.  You cough up green, yellow-brown, or bloody sputum.  You have pain or discomfort not controlled by medicines.  Your problems seem to be getting worse rather than better.  You have a fever. SEEK IMMEDIATE MEDICAL CARE IF:   You develop any new symptoms such as vomiting, severe headache, stiff or painful neck, chest pain, shortness of breath, or trouble swallowing.  You develop severe throat pain, drooling, or changes in your voice.  You develop swelling of the neck, or the skin on the neck becomes red and tender.  You develop signs of dehydration, such as fatigue, dry mouth, and decreased urination.  You become increasingly sleepy, or you cannot wake up completely. MAKE SURE  YOU:  Understand these instructions.  Will watch your condition.  Will get help right away if you are not doing well or get worse. Document Released: 06/02/2000 Document Revised: 10/20/2013 Document Reviewed: 08/04/2010 [facility] Patient Information 2015 [place], [place]. This information is not intended to replace advice given to you by your health care provider. Make sure you discuss any questions you have with your health care provider.  Upper Respiratory Infection, Adult An upper respiratory infection (URI) is also sometimes known as the common cold. The upper respiratory tract includes the nose, sinuses, throat, trachea, and bronchi. Bronchi are the airways leading to the lungs. Most people improve within 1 week, but symptoms can last up to 2 weeks. A residual cough may last even longer.  CAUSES Many different viruses can infect the tissues lining the upper respiratory tract. The tissues become irritated and inflamed and often become very moist. Mucus production is also common. A cold is contagious. You can easily spread the virus  to others by oral contact. This includes kissing, sharing a glass, coughing, or sneezing. Touching your mouth or nose and then touching a surface, which is then touched by another person, can also spread the virus. SYMPTOMS  Symptoms typically develop 1 to 3 days after you come in contact with a cold virus. Symptoms vary from person to person. They may include:  Runny nose.  Sneezing.  Nasal congestion.  Sinus irritation.  Sore throat.  Loss of voice (laryngitis).  Cough.  Fatigue.  Muscle aches.  Loss of appetite.  Headache.  Low-grade fever. DIAGNOSIS  You might diagnose your own cold based on familiar symptoms, since most people get a cold 2 to 3 times a year. Your caregiver can confirm this based on your exam. Most importantly, your caregiver can check that your symptoms are not due to another disease such as strep throat, sinusitis,  pneumonia, asthma, or epiglottitis. Blood tests, throat tests, and X-rays are not necessary to diagnose a common cold, but they may sometimes be helpful in excluding other more serious diseases. Your caregiver will decide if any further tests are required. RISKS AND COMPLICATIONS  You may be at risk for a more severe case of the common cold if you smoke cigarettes, have chronic heart disease (such as heart failure) or lung disease (such as asthma), or if you have a weakened immune system. The very young and very old are also at risk for more serious infections. Bacterial sinusitis, middle ear infections, and bacterial pneumonia can complicate the common cold. The common cold can worsen asthma and chronic obstructive pulmonary disease (COPD). Sometimes, these complications can require emergency medical care and may be life-threatening. PREVENTION  The best way to protect against getting a cold is to practice good hygiene. Avoid oral or hand contact with people with cold symptoms. Wash your hands often if contact occurs. There is no clear evidence that vitamin C, vitamin E, echinacea, or exercise reduces the chance of developing a cold. However, it is always recommended to get plenty of rest and practice good nutrition. TREATMENT  Treatment is directed at relieving symptoms. There is no cure. Antibiotics are not effective, because the infection is caused by a virus, not by bacteria. Treatment may include:  Increased fluid intake. Sports drinks offer valuable electrolytes, sugars, and fluids.  Breathing heated mist or steam (vaporizer or shower).  Eating chicken soup or other clear broths, and maintaining good nutrition.  Getting plenty of rest.  Using gargles or lozenges for comfort.  Controlling fevers with ibuprofen or acetaminophen as directed by your caregiver.  Increasing usage of your inhaler if you have asthma. Zinc gel and zinc lozenges, taken in the first 24 hours of the common cold, can  shorten the duration and lessen the severity of symptoms. Pain medicines may help with fever, muscle aches, and throat pain. A variety of non-prescription medicines are available to treat congestion and runny nose. Your caregiver can make recommendations and may suggest nasal or lung inhalers for other symptoms.  HOME CARE INSTRUCTIONS   Only take over-the-counter or prescription medicines for pain, discomfort, or fever as directed by your caregiver.  Use a warm mist humidifier or inhale steam from a shower to increase air moisture. This may keep secretions moist and make it easier to breathe.  Drink enough water and fluids to keep your urine clear or pale yellow.  Rest as needed.  Return to work when your temperature has returned to normal or as your caregiver advises. You  may need to stay home longer to avoid infecting others. You can also use a face mask and careful hand washing to prevent spread of the virus. SEEK MEDICAL CARE IF:   After the first few days, you feel you are getting worse rather than better.  You need your caregiver's advice about medicines to control symptoms.  You develop chills, worsening shortness of breath, or brown or red sputum. These may be signs of pneumonia.  You develop yellow or brown nasal discharge or pain in the face, especially when you bend forward. These may be signs of sinusitis.  You develop a fever, swollen neck glands, pain with swallowing, or white areas in the back of your throat. These may be signs of strep throat. SEEK IMMEDIATE MEDICAL CARE IF:   You have a fever.  You develop severe or persistent headache, ear pain, sinus pain, or chest pain.  You develop wheezing, a prolonged cough, cough up blood, or have a change in your usual mucus (if you have chronic lung disease).  You develop sore muscles or a stiff neck. Document Released: 11/29/2000 Document Revised: 08/28/2011 Document Reviewed: 09/10/2013 [facility] Patient Information 2015  [place], [place]. This information is not intended to replace advice given to you by your health care provider. Make sure you discuss any questions you have with your health care provider.

## 2014-03-18 NOTE — ED Provider Notes (Signed)
Medical screening examination/treatment/procedure(s) were performed by non-physician practitioner and as supervising physician I was immediately available for consultation/collaboration.   EKG Interpretation None        Mirian MoMatthew Azalea Cedar, MD 03/18/14 1123

## 2014-04-17 ENCOUNTER — Encounter (HOSPITAL_COMMUNITY): Payer: Self-pay | Admitting: Emergency Medicine

## 2014-04-17 ENCOUNTER — Emergency Department (HOSPITAL_COMMUNITY)
Admission: EM | Admit: 2014-04-17 | Discharge: 2014-04-17 | Disposition: A | Discharge status: Home / Self Care | Payer: Managed Care, Other (non HMO) | Attending: Emergency Medicine | Admitting: Emergency Medicine

## 2014-04-17 DIAGNOSIS — J029 Acute pharyngitis, unspecified: Secondary | ICD-10-CM | POA: Diagnosis present

## 2014-04-17 DIAGNOSIS — Z79899 Other long term (current) drug therapy: Secondary | ICD-10-CM | POA: Diagnosis not present

## 2014-04-17 DIAGNOSIS — Z791 Long term (current) use of non-steroidal anti-inflammatories (NSAID): Secondary | ICD-10-CM | POA: Diagnosis not present

## 2014-04-17 MED ORDER — AMOXICILLIN-POT CLAVULANATE 875-125 MG PO TABS: 1.0000 | ORAL_TABLET | Freq: Two times a day (BID) | ORAL | Status: DC

## 2014-04-17 NOTE — ED Provider Notes (Signed)
Medical screening examination/treatment/procedure(s) were performed by non-physician practitioner and as supervising physician I was immediately available for consultation/collaboration.     Zakariyah Freimark, MD 04/17/14 1532 

## 2014-04-17 NOTE — ED Provider Notes (Signed)
CSN: [id number]     Arrival date & time 04/17/14  1108 History   First MD Initiated Contact with Patient 04/17/14 1138     Chief Complaint  Patient presents with  . Sore Throat     (Consider location/radiation/quality/duration/timing/severity/associated sxs/prior Treatment) Patient is a 23 y.o. female presenting with pharyngitis. The history is provided by the patient. No language interpreter was used.  Sore Throat This is a new problem. The current episode started today. The problem occurs constantly. The problem has been gradually worsening. Associated symptoms include a sore throat. Nothing aggravates the symptoms. She has tried nothing for the symptoms. The treatment provided moderate relief.  Pt complains of a sorethroat.   Pt reports she was treated for strep with a shot.  Now has the same again  History reviewed. No pertinent past medical history. History reviewed. No pertinent past surgical history. No family history on file. History  Substance Use Topics  . Smoking status: Never Smoker   . Smokeless tobacco: Never Used  . Alcohol Use: No   OB History   Grav Para Term Preterm Abortions TAB SAB Ect Mult Living                 Review of Systems  HENT: Positive for sore throat.   All other systems reviewed and are negative.     Allergies  Peanut-containing drug products  Home Medications   Prior to Admission medications   Medication Sig Start Date End Date Taking? Authorizing Provider  acetaminophen-codeine (TYLENOL #3) 300-30 MG per tablet Take 1 tablet by mouth every 6 (six) hours as needed for moderate pain. 03/16/14   Mercedes Strupp Camprubi-Soms, PA-C  amoxicillin-clavulanate (AUGMENTIN) 875-125 MG per tablet Take 1 tablet by mouth 2 (two) times daily. 04/17/14   Elson AreasLeslie K Sofia, PA-C  diphenhydrAMINE (ALLERGY RELIEF) 25 MG tablet Take 25 mg by mouth every 6 (six) hours as needed for allergies (allergy related headache).     Historical Provider, MD   Levonorgestrel-Ethinyl Estradiol (CAMRESE) 0.15-0.03 &0.01 MG tablet Take 1 tablet by mouth every morning.    Historical Provider, MD  naproxen (NAPROSYN) 500 MG tablet Take 1 tablet (500 mg total) by mouth 2 (two) times daily as needed for mild pain, moderate pain or headache (TAKE WITH MEALS.). 03/16/14   Mercedes Strupp Camprubi-Soms, PA-C   BP 107/60  Pulse 70  Temp(Src) 98.3 F (36.8 C) (Oral)  Resp 16  SpO2 100% Physical Exam  Vitals reviewed. Constitutional: She appears well-developed and well-nourished.  HENT:  Right Ear: External ear normal.  Mouth/Throat: Oropharyngeal exudate present.  Erythema throat  Eyes: Pupils are equal, round, and reactive to light.  Neck: Normal range of motion. Neck supple.  Cardiovascular: Normal rate and normal heart sounds.   Pulmonary/Chest: Effort normal and breath sounds normal.  Abdominal: Soft.  Musculoskeletal: Normal range of motion.  Neurological: She is alert.  Skin: Skin is warm.  Psychiatric: She has a normal mood and affect.    ED Course  Procedures (including critical care time) Labs Review Labs Reviewed - No data to display  Imaging Review No results found.   EKG Interpretation None      MDM   Final diagnoses:  Pharyngitis    Augmentin gargles    Elson AreasLeslie K Sofia, PA-C 04/17/14 1151

## 2014-04-17 NOTE — ED Notes (Signed)
Pt states that she was seen here and treated with antibiotic shot for strep throat couple weeks ago. Pt states that she having sore throat that feels similar but worse than last time. Pt states that she didn't change out her toothbrush like was told to on the Internet.

## 2014-04-17 NOTE — Discharge Instructions (Signed)

## 2014-07-29 ENCOUNTER — Emergency Department (HOSPITAL_COMMUNITY)
Admission: EM | Admit: 2014-07-29 | Discharge: 2014-07-29 | Disposition: A | Discharge status: Home / Self Care | Payer: Managed Care, Other (non HMO) | Attending: Emergency Medicine | Admitting: Emergency Medicine

## 2014-07-29 ENCOUNTER — Encounter (HOSPITAL_COMMUNITY): Payer: Self-pay | Admitting: Emergency Medicine

## 2014-07-29 ENCOUNTER — Emergency Department (HOSPITAL_COMMUNITY): Payer: Managed Care, Other (non HMO)

## 2014-07-29 DIAGNOSIS — Z3202 Encounter for pregnancy test, result negative: Secondary | ICD-10-CM | POA: Diagnosis not present

## 2014-07-29 DIAGNOSIS — R1013 Epigastric pain: Secondary | ICD-10-CM | POA: Insufficient documentation

## 2014-07-29 DIAGNOSIS — Z79899 Other long term (current) drug therapy: Secondary | ICD-10-CM | POA: Insufficient documentation

## 2014-07-29 DIAGNOSIS — R109 Unspecified abdominal pain: Secondary | ICD-10-CM | POA: Diagnosis present

## 2014-07-29 LAB — URINALYSIS, ROUTINE W REFLEX MICROSCOPIC
Bilirubin Urine: NEGATIVE
GLUCOSE, UA: NEGATIVE mg/dL
Ketones, ur: 15 mg/dL — AB
Nitrite: NEGATIVE
Protein, ur: NEGATIVE mg/dL
Specific Gravity, Urine: 1.026 (ref 1.005–1.030)
Urobilinogen, UA: 1 mg/dL (ref 0.0–1.0)
pH: 6 (ref 5.0–8.0)

## 2014-07-29 LAB — COMPREHENSIVE METABOLIC PANEL
ALK PHOS: 36 U/L — AB (ref 39–117)
ALT: 18 U/L (ref 0–35)
AST: 21 U/L (ref 0–37)
Albumin: 4 g/dL (ref 3.5–5.2)
Anion gap: 7 (ref 5–15)
BILIRUBIN TOTAL: 0.6 mg/dL (ref 0.3–1.2)
BUN: 10 mg/dL (ref 6–23)
CHLORIDE: 105 mmol/L (ref 96–112)
CO2: 24 mmol/L (ref 19–32)
Calcium: 9.1 mg/dL (ref 8.4–10.5)
Creatinine, Ser: 0.7 mg/dL (ref 0.50–1.10)
GFR calc non Af Amer: >90 mL/min (ref >90)
Glucose, Bld: 82 mg/dL (ref 70–99)
Potassium: 3.7 mmol/L (ref 3.5–5.1)
Sodium: 136 mmol/L (ref 135–145)
TOTAL PROTEIN: 7.8 g/dL (ref 6.0–8.3)

## 2014-07-29 LAB — CBC WITH DIFFERENTIAL/PLATELET
Basophils Absolute: 0 10*3/uL (ref 0.0–0.1)
Basophils Relative: 0 % (ref 0–1)
EOS ABS: 0 10*3/uL (ref 0.0–0.7)
EOS PCT: 0 % (ref 0–5)
HCT: 40.8 % (ref 36.0–46.0)
HEMOGLOBIN: 13.7 g/dL (ref 12.0–15.0)
LYMPHS ABS: 1.1 10*3/uL (ref 0.7–4.0)
Lymphocytes Relative: 19 % (ref 12–46)
MCH: 28.9 pg (ref 26.0–34.0)
MCHC: 33.6 g/dL (ref 30.0–36.0)
MCV: 86.1 fL (ref 78.0–100.0)
MONOS PCT: 4 % (ref 3–12)
Monocytes Absolute: 0.2 10*3/uL (ref 0.1–1.0)
Neutro Abs: 4.2 10*3/uL (ref 1.7–7.7)
Neutrophils Relative %: 77 % (ref 43–77)
Platelets: 252 10*3/uL (ref 150–400)
RBC: 4.74 MIL/uL (ref 3.87–5.11)
RDW: 12.4 % (ref 11.5–15.5)
WBC: 5.5 10*3/uL (ref 4.0–10.5)

## 2014-07-29 LAB — URINE MICROSCOPIC-ADD ON

## 2014-07-29 LAB — LIPASE, BLOOD: Lipase: 19 U/L (ref 11–59)

## 2014-07-29 LAB — POC URINE PREG, ED: PREG TEST UR: NEGATIVE

## 2014-07-29 MED ORDER — DICYCLOMINE HCL 20 MG PO TABS: 20.0000 mg | ORAL_TABLET | Freq: Two times a day (BID) | ORAL | Status: DC

## 2014-07-29 MED ORDER — FAMOTIDINE IN NACL 20-0.9 MG/50ML-% IV SOLN
20.0000 mg | Freq: Once | INTRAVENOUS | Status: AC
  Administered 2014-07-29: 20 mg via INTRAVENOUS
  Filled 2014-07-29: qty 50

## 2014-07-29 MED ORDER — SODIUM CHLORIDE 0.9 % IV BOLUS (SEPSIS)
1000.0000 mL | Freq: Once | INTRAVENOUS | Status: AC
  Administered 2014-07-29: 1000 mL via INTRAVENOUS

## 2014-07-29 MED ORDER — SUCRALFATE 1 G PO TABS: 1.0000 g | ORAL_TABLET | Freq: Three times a day (TID) | ORAL | Status: DC

## 2014-07-29 MED ORDER — ESOMEPRAZOLE MAGNESIUM 20 MG PO CPDR: 20.0000 mg | DELAYED_RELEASE_CAPSULE | Freq: Every day | ORAL | Status: DC

## 2014-07-29 MED ORDER — GI COCKTAIL ~~LOC~~
30.0000 mL | Freq: Once | ORAL | Status: AC
  Administered 2014-07-29: 30 mL via ORAL
  Filled 2014-07-29: qty 30

## 2014-07-29 MED ORDER — MORPHINE SULFATE 2 MG/ML IJ SOLN
2.0000 mg | Freq: Once | INTRAMUSCULAR | Status: AC
  Administered 2014-07-29: 2 mg via INTRAVENOUS
  Filled 2014-07-29: qty 1

## 2014-07-29 MED ORDER — ONDANSETRON HCL 4 MG/2ML IJ SOLN
4.0000 mg | Freq: Once | INTRAMUSCULAR | Status: AC
  Administered 2014-07-29: 4 mg via INTRAVENOUS
  Filled 2014-07-29: qty 2

## 2014-07-29 NOTE — ED Provider Notes (Signed)
CSN: [id number]     Arrival date & time 07/29/14  1707 History   First MD Initiated Contact with Patient 07/29/14 1855     Chief Complaint  Patient presents with  . Abdominal Pain     (Consider location/radiation/quality/duration/timing/severity/associated sxs/prior Treatment) HPI  24 y/o F presenting to the ED for evaluation of abdominal pain. She states that this morning after she ate breakfast she had acute onset of crampy non-radiating epigastric pain and nausea. The pain has been intermittent throughout the day since that time. She does have a history of gastric reflux but is not currently taking any medications. LMP was 2 weeks ago. Denies vomiting, diarrhea, chest pain, SOB, dysuria, hematuria.     History reviewed. No pertinent past medical history. History reviewed. No pertinent past surgical history. History reviewed. No pertinent family history. History  Substance Use Topics  . Smoking status: Never Smoker   . Smokeless tobacco: Never Used  . Alcohol Use: No   OB History    No data available     Review of Systems  Ten systems reviewed and are negative for acute change, except as noted in the HPI.    Allergies  Peanut-containing drug products  Home Medications   Prior to Admission medications   Medication Sig Start Date End Date Taking? Authorizing Provider  diphenhydrAMINE (ALLERGY RELIEF) 25 MG tablet Take 25 mg by mouth every 6 (six) hours as needed for allergies (allergy related headache).    Yes Historical Provider, MD  Levonorgestrel-Ethinyl Estradiol (CAMRESE) 0.15-0.03 &0.01 MG tablet Take 1 tablet by mouth every morning.   Yes Historical Provider, MD  acetaminophen-codeine (TYLENOL #3) 300-30 MG per tablet Take 1 tablet by mouth every 6 (six) hours as needed for moderate pain. Patient not taking: Reported on 07/29/2014 03/16/14   Patty Sermons Camprubi-Soms, PA-C  amoxicillin-clavulanate (AUGMENTIN) 875-125 MG per tablet Take 1 tablet by mouth 2 (two)  times daily. Patient not taking: Reported on 07/29/2014 04/17/14   Fransico Meadow, PA-C  dicyclomine (BENTYL) 20 MG tablet Take 1 tablet (20 mg total) by mouth 2 (two) times daily. 07/29/14   Margarita Mail, PA-C  esomeprazole (NEXIUM) 20 MG capsule Take 1 capsule (20 mg total) by mouth daily at 12 noon. 07/29/14   Margarita Mail, PA-C  naproxen (NAPROSYN) 500 MG tablet Take 1 tablet (500 mg total) by mouth 2 (two) times daily as needed for mild pain, moderate pain or headache (TAKE WITH MEALS.). Patient not taking: Reported on 07/29/2014 03/16/14   Patty Sermons Camprubi-Soms, PA-C  sucralfate (CARAFATE) 1 G tablet Take 1 tablet (1 g total) by mouth 4 (four) times daily -  with meals and at bedtime. 07/29/14   Tamelia Michalowski, PA-C   BP 109/66 mmHg  Pulse 60  Temp(Src) 98.4 F (36.9 C) (Oral)  Resp 18  SpO2 100%  LMP 07/26/2014 (Approximate) Physical Exam  Constitutional: She is oriented to person, place, and time. She appears well-developed and well-nourished. No distress.  HENT:  Head: Normocephalic and atraumatic.  Eyes: Conjunctivae are normal. No scleral icterus.  Neck: Normal range of motion.  Cardiovascular: Normal rate, regular rhythm and normal heart sounds.  Exam reveals no gallop and no friction rub.   No murmur heard. Pulmonary/Chest: Effort normal and breath sounds normal. No respiratory distress.  Abdominal: Soft. Bowel sounds are normal. She exhibits no distension and no mass. There is no tenderness. There is no guarding.  Neurological: She is alert and oriented to person, place, and time.  Skin:  Skin is warm and dry. She is not diaphoretic.   Marland Kitchen  ED Course  Procedures (including critical care time) Labs Review Labs Reviewed  COMPREHENSIVE METABOLIC PANEL - Abnormal; Notable for the following:    Alkaline Phosphatase 36 (*)    All other components within normal limits  URINALYSIS, ROUTINE W REFLEX MICROSCOPIC - Abnormal; Notable for the following:    APPearance CLOUDY  (*)    Hgb urine dipstick TRACE (*)    Ketones, ur 15 (*)    Leukocytes, UA SMALL (*)    All other components within normal limits  URINE MICROSCOPIC-ADD ON - Abnormal; Notable for the following:    Squamous Epithelial / LPF MANY (*)    All other components within normal limits  CBC WITH DIFFERENTIAL/PLATELET  LIPASE, BLOOD  POC URINE PREG, ED    Imaging Review No results found.   EKG Interpretation None      MDM   Final diagnoses:  Epigastric pain  Patient with c/o aprigastric pain. Labs show slightly elevated alk phos. Will obtain US to r/o acute cholecystitis. Will treat for gastritis with pepcid, GI cocktail.  Patient is nontoxic, nonseptic appearing, in no apparent distress.  Patient's pain and other symptoms adequately managed in emergency department.  Fluid bolus given.  Labs, imaging and vitals reviewed. No apparent abnormalities on [place] and labs WNL. Urine appears contaminated.  Patient does not meet the SIRS or Sepsis criteria.  On repeat exam patient does not have a surgical abdomen and there are no peritoneal signs.  No indication of appendicitis, bowel obstruction, bowel perforation, cholecystitis, diverticulitis,  Patient discharged home with symptomatic treatment and given strict instructions for follow-up with their primary care physician.  I have also discussed reasons to return immediately to the ER.  Patient expresses understanding and agrees with plan.     Margarita Mail, PA-C 08/01/14 2005  Wandra Arthurs, MD 08/01/14 [phone]

## 2014-07-29 NOTE — ED Notes (Signed)
Patient presents to ED with c/o abdominal pain.  Pt reports that she started having epigastric pain this morning; pain has been constant with nausea, no vomiting or diarrhea.

## 2014-07-29 NOTE — Discharge Instructions (Signed)
Abdominal Pain, Women °Abdominal (stomach, pelvic, or belly) pain can be caused by many things. It is important to tell your doctor: °· The location of the pain. °· Does it come and go or is it present all the time? °· Are there things that start the pain (eating certain foods, exercise)? °· Are there other symptoms associated with the pain (fever, nausea, vomiting, diarrhea)? °All of this is helpful to know when trying to find the cause of the pain. °CAUSES  °· Stomach: virus or bacteria infection, or ulcer. °· Intestine: appendicitis (inflamed appendix), regional ileitis (Crohn's disease), ulcerative colitis (inflamed colon), irritable bowel syndrome, diverticulitis (inflamed diverticulum of the colon), or cancer of the stomach or intestine. °· Gallbladder disease or stones in the gallbladder. °· Kidney disease, kidney stones, or infection. °· Pancreas infection or cancer. °· Fibromyalgia (pain disorder). °· Diseases of the female organs: °¨ Uterus: fibroid (non-cancerous) tumors or infection. °¨ Fallopian tubes: infection or tubal pregnancy. °¨ Ovary: cysts or tumors. °¨ Pelvic adhesions (scar tissue). °¨ Endometriosis (uterus lining tissue growing in the pelvis and on the pelvic organs). °¨ Pelvic congestion syndrome (female organs filling up with blood just before the menstrual period). °¨ Pain with the menstrual period. °¨ Pain with ovulation (producing an egg). °¨ Pain with an IUD (intrauterine device, birth control) in the uterus. °¨ Cancer of the female organs. °· Functional pain (pain not caused by a disease, may improve without treatment). °· Psychological pain. °· Depression. °DIAGNOSIS  °Your doctor will decide the seriousness of your pain by doing an examination. °· Blood tests. °· X-rays. °· Ultrasound. °· CT scan (computed tomography, special type of X-ray). °· MRI (magnetic resonance imaging). °· Cultures, for infection. °· Barium enema (dye inserted in the large intestine, to better view it with  X-rays). °· Colonoscopy (looking in intestine with a lighted tube). °· Laparoscopy (minor surgery, looking in abdomen with a lighted tube). °· Major abdominal exploratory surgery (looking in abdomen with a large incision). °TREATMENT  °The treatment will depend on the cause of the pain.  °· Many cases can be observed and treated at home. °· Over-the-counter medicines recommended by your caregiver. °· Prescription medicine. °· Antibiotics, for infection. °· Birth control pills, for painful periods or for ovulation pain. °· Hormone treatment, for endometriosis. °· Nerve blocking injections. °· Physical therapy. °· Antidepressants. °· Counseling with a psychologist or psychiatrist. °· Minor or major surgery. °HOME CARE INSTRUCTIONS  °· Do not take laxatives, unless directed by your caregiver. °· Take over-the-counter pain medicine only if ordered by your caregiver. Do not take aspirin because it can cause an upset stomach or bleeding. °· Try a clear liquid diet (broth or water) as ordered by your caregiver. Slowly move to a bland diet, as tolerated, if the pain is related to the stomach or intestine. °· Have a thermometer and take your temperature several times a day, and record it. °· Bed rest and sleep, if it helps the pain. °· Avoid sexual intercourse, if it causes pain. °· Avoid stressful situations. °· Keep your follow-up appointments and tests, as your caregiver orders. °· If the pain does not go away with medicine or surgery, you may try: °¨ Acupuncture. °¨ Relaxation exercises (yoga, meditation). °¨ Group therapy. °¨ Counseling. °SEEK MEDICAL CARE IF:  °· You notice certain foods cause stomach pain. °· Your home care treatment is not helping your pain. °· You need stronger pain medicine. °· You want your IUD removed. °· You feel faint or   lightheaded. °· You develop nausea and vomiting. °· You develop a rash. °· You are having side effects or an allergy to your medicine. °SEEK IMMEDIATE MEDICAL CARE IF:  °· Your  pain does not go away or gets worse. °· You have a fever. °· Your pain is felt only in portions of the abdomen. The right side could possibly be appendicitis. The left lower portion of the abdomen could be colitis or diverticulitis. °· You are passing blood in your stools (bright red or black tarry stools, with or without vomiting). °· You have blood in your urine. °· You develop chills, with or without a fever. °· You pass out. °MAKE SURE YOU:  °· Understand these instructions. °· Will watch your condition. °· Will get help right away if you are not doing well or get worse. °Document Released: 04/02/2007 Document Revised: 10/20/2013 Document Reviewed: 04/22/2009 °[employer]® Patient Information ©2015 [employer]. This information is not intended to replace advice given to you by your health care provider. Make sure you discuss any questions you have with your health care provider. ° °

## 2014-11-25 ENCOUNTER — Emergency Department (HOSPITAL_COMMUNITY)
Admission: EM | Admit: 2014-11-25 | Discharge: 2014-11-25 | Disposition: A | Discharge status: Home / Self Care | Payer: Managed Care, Other (non HMO) | Attending: Emergency Medicine | Admitting: Emergency Medicine

## 2014-11-25 ENCOUNTER — Encounter (HOSPITAL_COMMUNITY): Payer: Self-pay | Admitting: Emergency Medicine

## 2014-11-25 DIAGNOSIS — R05 Cough: Secondary | ICD-10-CM | POA: Diagnosis present

## 2014-11-25 DIAGNOSIS — J069 Acute upper respiratory infection, unspecified: Secondary | ICD-10-CM | POA: Diagnosis not present

## 2014-11-25 DIAGNOSIS — Z79899 Other long term (current) drug therapy: Secondary | ICD-10-CM | POA: Diagnosis not present

## 2014-11-25 MED ORDER — AZITHROMYCIN 250 MG PO TABS: 250.0000 mg | ORAL_TABLET | Freq: Every day | ORAL | Status: DC

## 2014-11-25 NOTE — Discharge Instructions (Signed)
Cough, Adult ° A cough is a reflex. It helps you clear your throat and airways. A cough can help heal your body. A cough can last 2 or 3 weeks (acute) or may last more than 8 weeks (chronic). Some common causes of a cough can include an infection, allergy, or a cold. °HOME CARE °· Only take medicine as told by your doctor. °· If given, take your medicines (antibiotics) as told. Finish them even if you start to feel better. °· Use a cold steam vaporizer or humidifier in your home. This can help loosen thick spit (secretions). °· Sleep so you are almost sitting up (semi-upright). Use pillows to do this. This helps reduce coughing. °· Rest as needed. °· Stop smoking if you smoke. °GET HELP RIGHT AWAY IF: °· You have yellowish-white fluid (pus) in your thick spit. °· Your cough gets worse. °· Your medicine does not reduce coughing, and you are losing sleep. °· You cough up blood. °· You have trouble breathing. °· Your pain gets worse and medicine does not help. °· You have a fever. °MAKE SURE YOU:  °· Understand these instructions. °· Will watch your condition. °· Will get help right away if you are not doing well or get worse. °Document Released: 02/16/2011 Document Revised: 10/20/2013 Document Reviewed: 02/16/2011 °[employer]® Patient Information ©2015 [employer]. This information is not intended to replace advice given to you by your health care provider. Make sure you discuss any questions you have with your health care provider. ° °Upper Respiratory Infection, Adult °An upper respiratory infection (URI) is also sometimes known as the common cold. The upper respiratory tract includes the nose, sinuses, throat, trachea, and bronchi. Bronchi are the airways leading to the lungs. Most people improve within 1 week, but symptoms can last up to 2 weeks. A residual cough may last even longer.  °CAUSES °Many different viruses can infect the tissues lining the upper respiratory tract. The tissues become irritated and  inflamed and often become very moist. Mucus production is also common. A cold is contagious. You can easily spread the virus to others by oral contact. This includes kissing, sharing a glass, coughing, or sneezing. Touching your mouth or nose and then touching a surface, which is then touched by another person, can also spread the virus. °SYMPTOMS  °Symptoms typically develop 1 to 3 days after you come in contact with a cold virus. Symptoms vary from person to person. They may include: °· Runny nose. °· Sneezing. °· Nasal congestion. °· Sinus irritation. °· Sore throat. °· Loss of voice (laryngitis). °· Cough. °· Fatigue. °· Muscle aches. °· Loss of appetite. °· Headache. °· Low-grade fever. °DIAGNOSIS  °You might diagnose your own cold based on familiar symptoms, since most people get a cold 2 to 3 times a year. Your caregiver can confirm this based on your exam. Most importantly, your caregiver can check that your symptoms are not due to another disease such as strep throat, sinusitis, pneumonia, asthma, or epiglottitis. Blood tests, throat tests, and X-rays are not necessary to diagnose a common cold, but they may sometimes be helpful in excluding other more serious diseases. Your caregiver will decide if any further tests are required. °RISKS AND COMPLICATIONS  °You may be at risk for a more severe case of the common cold if you smoke cigarettes, have chronic heart disease (such as heart failure) or lung disease (such as asthma), or if you have a weakened immune system. The very young and very old are   also at risk for more serious infections. Bacterial sinusitis, middle ear infections, and bacterial pneumonia can complicate the common cold. The common cold can worsen asthma and chronic obstructive pulmonary disease (COPD). Sometimes, these complications can require emergency medical care and may be life-threatening. °PREVENTION  °The best way to protect against getting a cold is to practice good hygiene. Avoid  oral or hand contact with people with cold symptoms. Wash your hands often if contact occurs. There is no clear evidence that vitamin C, vitamin E, echinacea, or exercise reduces the chance of developing a cold. However, it is always recommended to get plenty of rest and practice good nutrition. °TREATMENT  °Treatment is directed at relieving symptoms. There is no cure. Antibiotics are not effective, because the infection is caused by a virus, not by bacteria. Treatment may include: °· Increased fluid intake. Sports drinks offer valuable electrolytes, sugars, and fluids. °· Breathing heated mist or steam (vaporizer or shower). °· Eating chicken soup or other clear broths, and maintaining good nutrition. °· Getting plenty of rest. °· Using gargles or lozenges for comfort. °· Controlling fevers with ibuprofen or acetaminophen as directed by your caregiver. °· Increasing usage of your inhaler if you have asthma. °Zinc gel and zinc lozenges, taken in the first 24 hours of the common cold, can shorten the duration and lessen the severity of symptoms. Pain medicines may help with fever, muscle aches, and throat pain. A variety of non-prescription medicines are available to treat congestion and runny nose. Your caregiver can make recommendations and may suggest nasal or lung inhalers for other symptoms.  °HOME CARE INSTRUCTIONS  °· Only take over-the-counter or prescription medicines for pain, discomfort, or fever as directed by your caregiver. °· Use a warm mist humidifier or inhale steam from a shower to increase air moisture. This may keep secretions moist and make it easier to breathe. °· Drink enough water and fluids to keep your urine clear or pale yellow. °· Rest as needed. °· Return to work when your temperature has returned to normal or as your caregiver advises. You may need to stay home longer to avoid infecting others. You can also use a face mask and careful hand washing to prevent spread of the virus. °SEEK  MEDICAL CARE IF:  °· After the first few days, you feel you are getting worse rather than better. °· You need your caregiver's advice about medicines to control symptoms. °· You develop chills, worsening shortness of breath, or brown or red sputum. These may be signs of pneumonia. °· You develop yellow or brown nasal discharge or pain in the face, especially when you bend forward. These may be signs of sinusitis. °· You develop a fever, swollen neck glands, pain with swallowing, or white areas in the back of your throat. These may be signs of strep throat. °SEEK IMMEDIATE MEDICAL CARE IF:  °· You have a fever. °· You develop severe or persistent headache, ear pain, sinus pain, or chest pain. °· You develop wheezing, a prolonged cough, cough up blood, or have a change in your usual mucus (if you have chronic lung disease). °· You develop sore muscles or a stiff neck. °Document Released: 11/29/2000 Document Revised: 08/28/2011 Document Reviewed: 09/10/2013 °[employer]® Patient Information ©2015 [employer]. This information is not intended to replace advice given to you by your health care provider. Make sure you discuss any questions you have with your health care provider. ° °

## 2014-11-25 NOTE — ED Provider Notes (Signed)
CSN: [id number]     Arrival date & time 11/25/14  1909 History  This chart was scribed for non-physician practitioner, Elpidio Anis, working with Azalia Bilis, MD by Richarda Overlie, ED Scribe. This patient was seen in room WTR5/WTR5 and the patient's care was started at 8:12 PM.   Chief Complaint  Patient presents with  . Cough  . Generalized Body Aches   The history is provided by the patient. No language interpreter was used.   HPI Comments: Julie Mason is a 24 y.o. female who presents to the Emergency Department complaining of cough for the last week that worsened last night. She states that her cough became productive last night and she noticed some minimal blood in the phlegm. Pt reports associated generalized body aches, sore throat, rhinorrhea, congestion, HA and chills. Pt states she has taken robitussin without relief. She denies any hx of smoking. Pt reports no pertinent past medical history. She reports no known sick contacts. Pt reports no alleviating factors at this time.   History reviewed. No pertinent past medical history. History reviewed. No pertinent past surgical history. No family history on file. History  Substance Use Topics  . Smoking status: Never Smoker   . Smokeless tobacco: Never Used  . Alcohol Use: No   OB History    No data available     Review of Systems  Constitutional: Positive for fever and chills.  HENT: Positive for congestion, rhinorrhea and sore throat.   Respiratory: Positive for cough.   Gastrointestinal: Negative for nausea and vomiting.  Musculoskeletal: Positive for myalgias.  Neurological: Positive for headaches.   Allergies  Peanut-containing drug products  Home Medications   Prior to Admission medications   Medication Sig Start Date End Date Taking? Authorizing Provider  acetaminophen-codeine (TYLENOL #3) 300-30 MG per tablet Take 1 tablet by mouth every 6 (six) hours as needed for moderate pain. Patient not taking: Reported  on 07/29/2014 03/16/14   Mercedes Camprubi-Soms, PA-C  amoxicillin-clavulanate (AUGMENTIN) 875-125 MG per tablet Take 1 tablet by mouth 2 (two) times daily. Patient not taking: Reported on 07/29/2014 04/17/14   Elson Areas, PA-C  dicyclomine (BENTYL) 20 MG tablet Take 1 tablet (20 mg total) by mouth 2 (two) times daily. 07/29/14   Arthor Captain, PA-C  diphenhydrAMINE (ALLERGY RELIEF) 25 MG tablet Take 25 mg by mouth every 6 (six) hours as needed for allergies (allergy related headache).     Historical Provider, MD  esomeprazole (NEXIUM) 20 MG capsule Take 1 capsule (20 mg total) by mouth daily at 12 noon. 07/29/14   Arthor Captain, PA-C  Levonorgestrel-Ethinyl Estradiol (CAMRESE) 0.15-0.03 &0.01 MG tablet Take 1 tablet by mouth every morning.    Historical Provider, MD  naproxen (NAPROSYN) 500 MG tablet Take 1 tablet (500 mg total) by mouth 2 (two) times daily as needed for mild pain, moderate pain or headache (TAKE WITH MEALS.). Patient not taking: Reported on 07/29/2014 03/16/14   Mercedes Camprubi-Soms, PA-C  sucralfate (CARAFATE) 1 G tablet Take 1 tablet (1 g total) by mouth 4 (four) times daily -  with meals and at bedtime. 07/29/14   Abigail Harris, PA-C   BP 107/77 mmHg  Pulse 92  Temp(Src) 99 F (37.2 C) (Oral)  Resp 16  Wt 160 lb (72.576 kg)  SpO2 99%  LMP 09/25/2014   Physical Exam  Constitutional: She is oriented to person, place, and time. She appears well-developed and well-nourished.  HENT:  Head: Normocephalic and atraumatic.  Nose: Mucosal edema ( mild)  present.  Eyes: Right eye exhibits no discharge. Left eye exhibits no discharge.  Neck: Neck supple. No tracheal deviation present.  Cardiovascular: Normal rate, regular rhythm and normal heart sounds.   No murmur heard. Pulmonary/Chest: Effort normal and breath sounds normal. No respiratory distress. She has no wheezes. She has no rhonchi. She has no rales.  Abdominal: She exhibits no distension.  Neurological: She is alert  and oriented to person, place, and time.  Skin: Skin is warm and dry.  Psychiatric: She has a normal mood and affect. Her behavior is normal.  Nursing note and vitals reviewed.   ED Course  Procedures   DIAGNOSTIC STUDIES: Oxygen Saturation is 99% on RA, normal by my interpretation.    COORDINATION OF CARE: 8:15 PM Discussed treatment plan with pt at bedside and pt agreed to plan.   Labs Review Labs Reviewed - No data to display  Imaging Review No results found.   EKG Interpretation None      MDM   Final diagnoses:  None  1. URI  Patient is well appearing. Symptoms of cough for the past week, worsening now with productive cough, chills, myalgias. Will treat with abx, supportive care otherwise.   I personally performed the services described in this documentation, which was scribed in my presence. The recorded information has been reviewed and is accurate.       Elpidio AnisShari Annalyce Lanpher, PA-C 11/26/14 0448  Elwin MochaBlair Walden, MD 11/26/14 0630

## 2014-11-25 NOTE — ED Notes (Signed)
Pt c/o cough and generalized body aches since last night. Denies N/V. Pt c/o sore throat. C/o chills. Pt taking OTC Robitussin without relief. Denies chest pain or SOB. A&Ox4 and ambulatory.

## 2015-02-03 ENCOUNTER — Encounter (HOSPITAL_COMMUNITY): Payer: Self-pay | Admitting: *Deleted

## 2015-02-03 ENCOUNTER — Emergency Department (HOSPITAL_COMMUNITY)
Admission: EM | Admit: 2015-02-03 | Discharge: 2015-02-03 | Disposition: A | Discharge status: Home / Self Care | Payer: Managed Care, Other (non HMO) | Attending: Emergency Medicine | Admitting: Emergency Medicine

## 2015-02-03 DIAGNOSIS — H9201 Otalgia, right ear: Secondary | ICD-10-CM | POA: Diagnosis present

## 2015-02-03 DIAGNOSIS — R0981 Nasal congestion: Secondary | ICD-10-CM | POA: Insufficient documentation

## 2015-02-03 DIAGNOSIS — Z79818 Long term (current) use of other agents affecting estrogen receptors and estrogen levels: Secondary | ICD-10-CM | POA: Insufficient documentation

## 2015-02-03 NOTE — ED Provider Notes (Signed)
CSN: [id number]     Arrival date & time 02/03/15  1907 History  This chart was scribed for Earley Favor, NP, working with Laurence Spates, MD by Chestine Spore, ED Scribe. The patient was seen in room WTR5/WTR5 at 8:43 PM.     Chief Complaint  Patient presents with  . Otalgia      The history is provided by the patient. No language interpreter was used.    Julie Mason is a 24 y.o. female who presents to the Emergency Department complaining of right ear discomfort onset 2 days. Pt thinks that she may have a cerumen impaction and that she is having decreased hearing. Pt is having associated symptoms of decreased hearing, rhinorrhea, congestion, and sinus congestion. She notes that she has tried sweet oil without hydrogen peroxide with no relief of her symptoms. She denies right ear drainage, fever, chills, cough, SOB, and any other symptoms.    History reviewed. No pertinent past medical history. History reviewed. No pertinent past surgical history. History reviewed. No pertinent family history. Social History  Substance Use Topics  . Smoking status: Never Smoker   . Smokeless tobacco: Never Used  . Alcohol Use: Yes     Comment: rarely   OB History    No data available     Review of Systems  HENT: Positive for congestion, ear pain, hearing loss and rhinorrhea. Negative for ear discharge and postnasal drip.   Respiratory: Negative for cough and shortness of breath.   All other systems reviewed and are negative.     Allergies  Peanut-containing drug products  Home Medications   Prior to Admission medications   Medication Sig Start Date End Date Taking? Authorizing Provider  Levonorgestrel-Ethinyl Estradiol (CAMRESE) 0.15-0.03 &0.01 MG tablet Take 1 tablet by mouth every morning.   Yes Historical Provider, MD  pseudoephedrine (SUDAFED) 30 MG tablet Take 30 mg by mouth every 4 (four) hours as needed for congestion.   Yes Historical Provider, MD  acetaminophen-codeine  (TYLENOL #3) 300-30 MG per tablet Take 1 tablet by mouth every 6 (six) hours as needed for moderate pain. Patient not taking: Reported on 07/29/2014 03/16/14   Mercedes Camprubi-Soms, PA-C  amoxicillin-clavulanate (AUGMENTIN) 875-125 MG per tablet Take 1 tablet by mouth 2 (two) times daily. Patient not taking: Reported on 07/29/2014 04/17/14   Elson Areas, PA-C  azithromycin (ZITHROMAX) 250 MG tablet Take 1 tablet (250 mg total) by mouth daily. Take first 2 tablets together, then 1 every day until finished. Patient not taking: Reported on 02/03/2015 11/25/14   Elpidio Anis, PA-C  dicyclomine (BENTYL) 20 MG tablet Take 1 tablet (20 mg total) by mouth 2 (two) times daily. Patient not taking: Reported on 02/03/2015 07/29/14   Arthor Captain, PA-C  diphenhydrAMINE (ALLERGY RELIEF) 25 MG tablet Take 25 mg by mouth every 6 (six) hours as needed for allergies (allergy related headache).     Historical Provider, MD  esomeprazole (NEXIUM) 20 MG capsule Take 1 capsule (20 mg total) by mouth daily at 12 noon. Patient not taking: Reported on 02/03/2015 07/29/14   Arthor Captain, PA-C  naproxen (NAPROSYN) 500 MG tablet Take 1 tablet (500 mg total) by mouth 2 (two) times daily as needed for mild pain, moderate pain or headache (TAKE WITH MEALS.). Patient not taking: Reported on 07/29/2014 03/16/14   Mercedes Camprubi-Soms, PA-C  sucralfate (CARAFATE) 1 G tablet Take 1 tablet (1 g total) by mouth 4 (four) times daily -  with meals and at bedtime. Patient not  taking: Reported on 02/03/2015 07/29/14   Arthor Captain, PA-C   BP 107/70 mmHg  Pulse 68  Temp(Src) 98.4 F (36.9 C) (Oral)  Resp 16  Ht  (1.651 m)  SpO2 100%  LMP 12/04/2014 (Approximate) Physical Exam  Constitutional: She is oriented to person, place, and time. She appears well-developed and well-nourished. No distress.  HENT:  Head: Normocephalic and atraumatic.  Left Ear: Tympanic membrane and ear canal normal.  Mouth/Throat: Oropharynx is clear  and moist. No oropharyngeal exudate.  Fluid behind the right ear otherwise normal.  Eyes: EOM are normal. Pupils are equal, round, and reactive to light.  Neck: Neck supple. No tracheal deviation present.  Cardiovascular: Normal rate and intact distal pulses.   Pulmonary/Chest: Effort normal. No respiratory distress.  Musculoskeletal: Normal range of motion.  Neurological: She is alert and oriented to person, place, and time.  Skin: Skin is warm and dry.  Psychiatric: She has a normal mood and affect. Her behavior is normal.  Nursing note and vitals reviewed.   ED Course  Procedures (including critical care time) DIAGNOSTIC STUDIES: Oxygen Saturation is 100% on RA, nl by my interpretation.    COORDINATION OF CARE: 8:45 PM Discussed treatment plan with pt at bedside and pt agreed to plan.   Labs Review Labs Reviewed - No data to display  Imaging Review No results found. I have personally reviewed and evaluated these images and lab results as part of my medical decision-making.   EKG Interpretation None    ecommended that patient continue taking decongestion tablets a regular basis for the next several days  MDM   Final diagnoses:  Sinus congestion    I personally performed the services described in this documentation, which was scribed in my presence. The recorded information has been reviewed and is accurate.   Earley Favor, NP 02/03/15 2101  Laurence Spates, MD 02/04/15 [phone]

## 2015-02-03 NOTE — ED Notes (Signed)
Pt admits to decreased hearing and discomfort to rt ear x2 days, pt concerned she may have a cerumen impaction. Pt denies fever.

## 2015-02-03 NOTE — Discharge Instructions (Signed)
You've been given a list ofdecongestion tablets that he can take over-the-counter.  These will help with the congestion.  Please take these on a regular basis

## 2015-07-16 IMAGING — US US ABDOMEN COMPLETE
1 series · 14 of 25 positions shown · non-contrast
Comparison: None.

CLINICAL DATA: Epigastric pain and nausea.

EXAM:
ULTRASOUND ABDOMEN COMPLETE

[Series 1: us abdomen complete · 0.17mm/px · 14 of 74 slices shown]
[im 1/74]
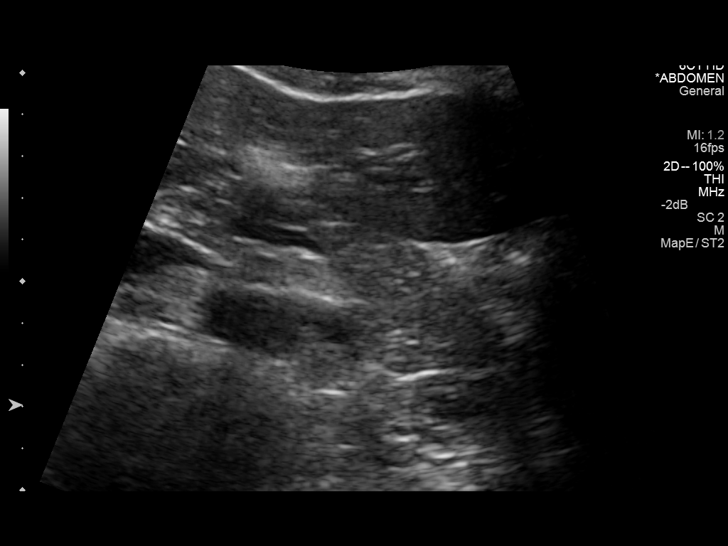
[im 7/74]
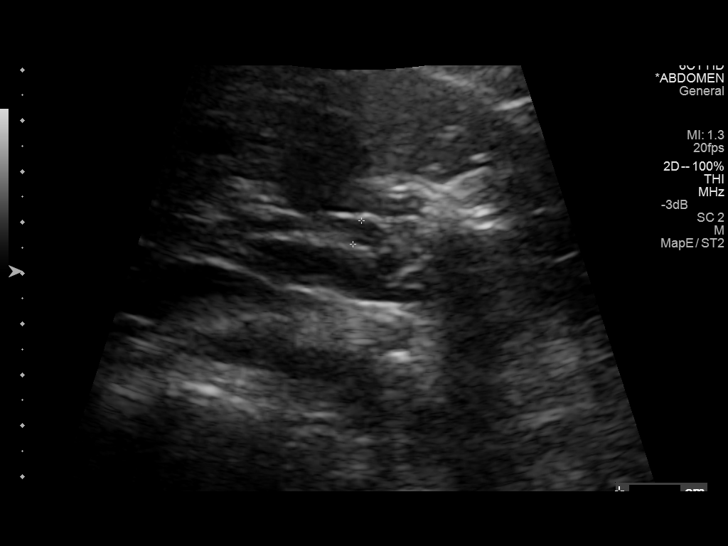
[im 13/74]
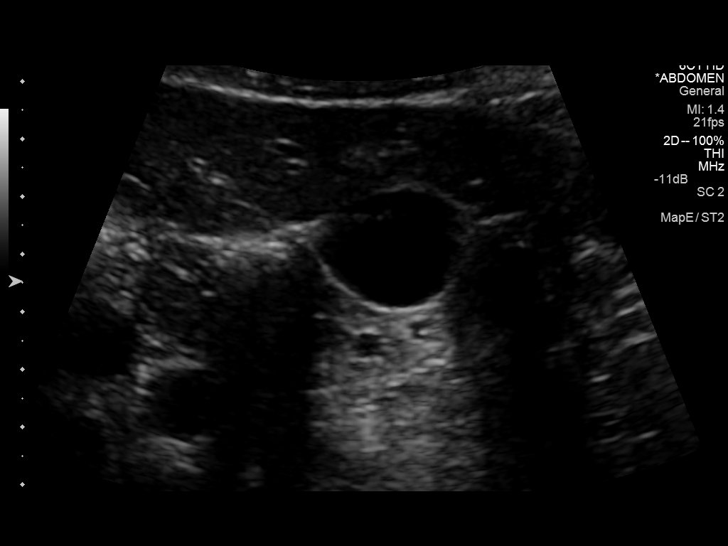
[im 19/74]
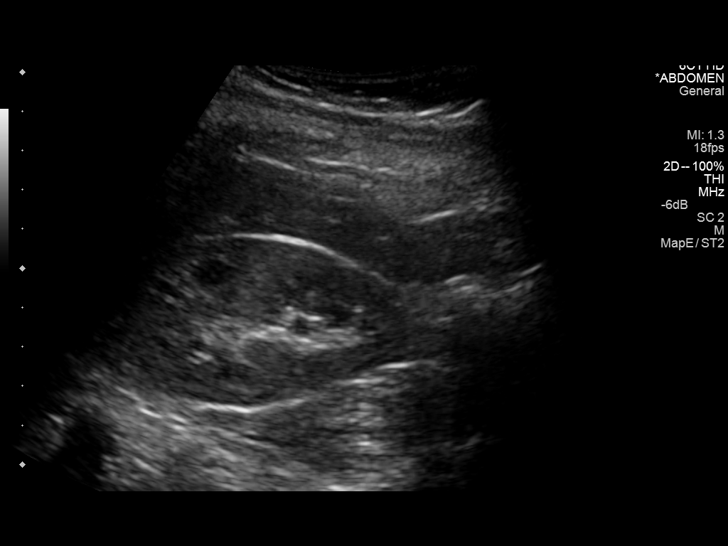
[im 25/74]
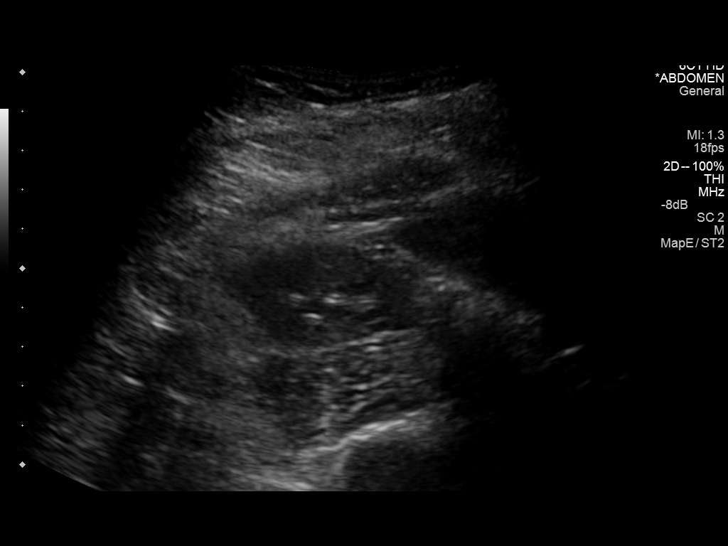
[im 28/74]
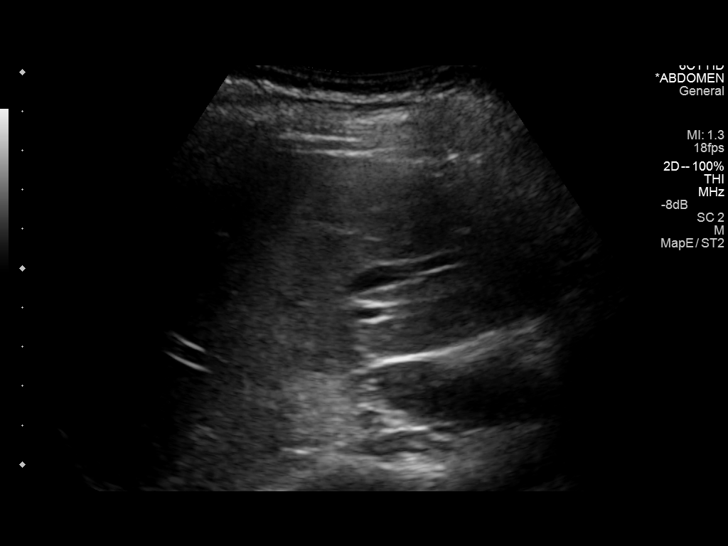
[im 34/74]
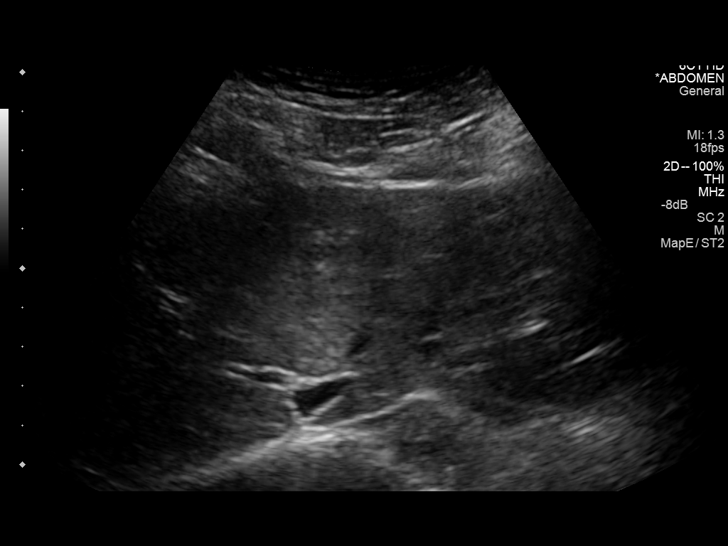
[im 40/74]
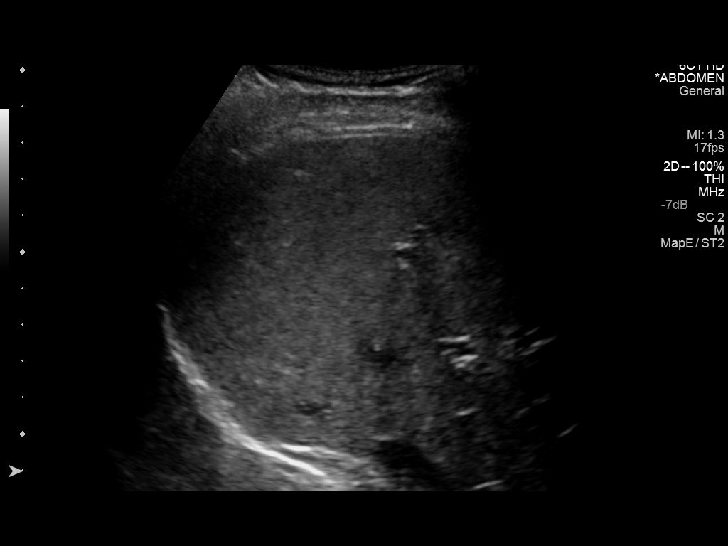
[im 46/74]
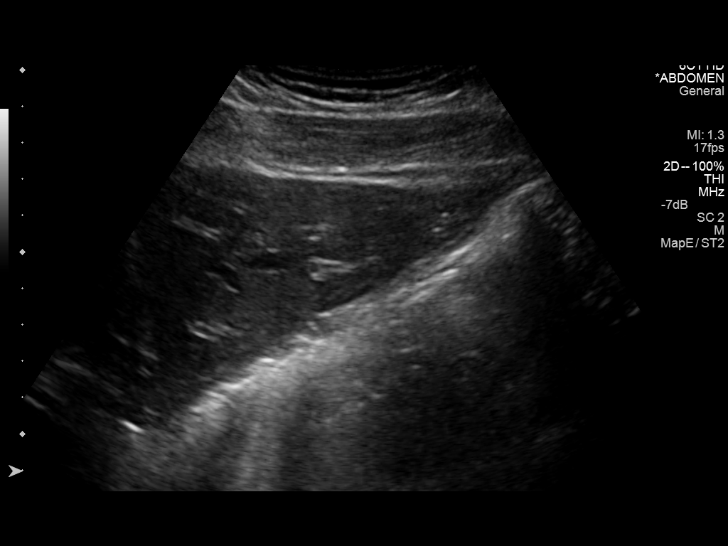
[im 49/74]
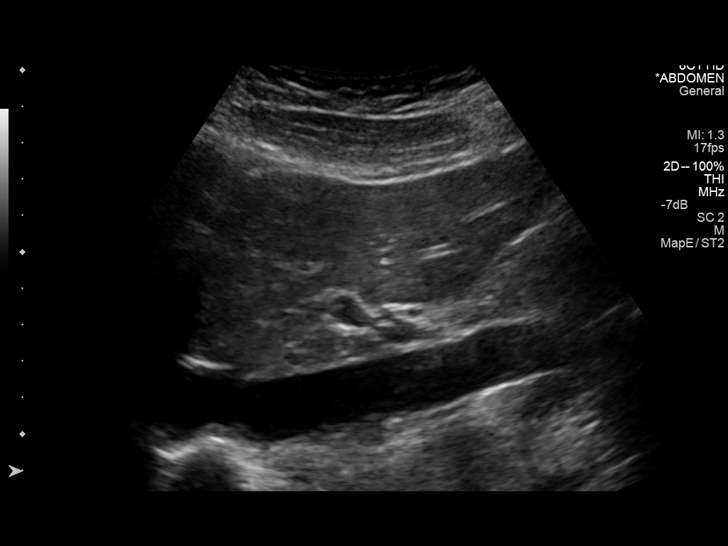
[im 55/74]
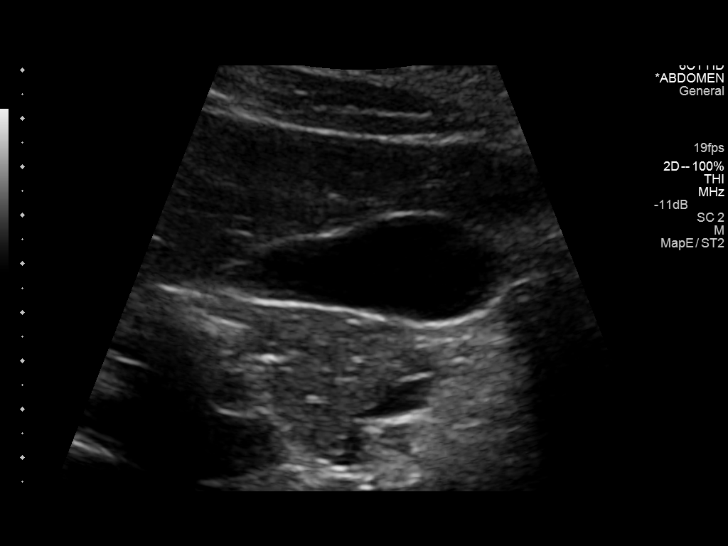
[im 61/74]
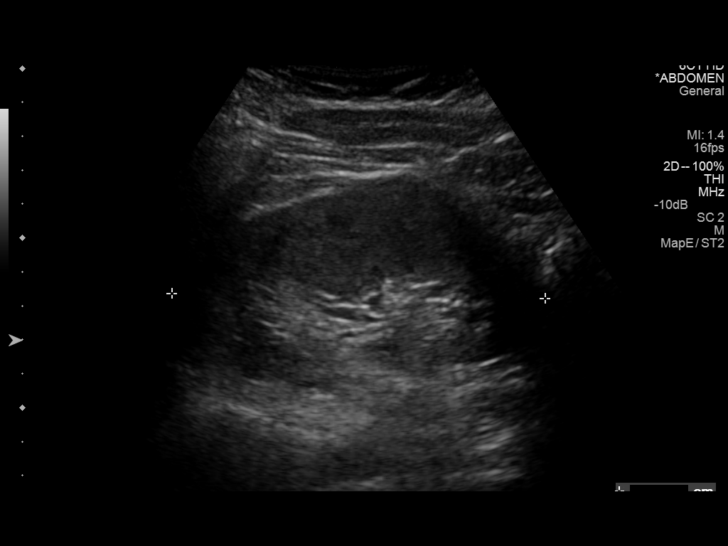
[im 67/74]
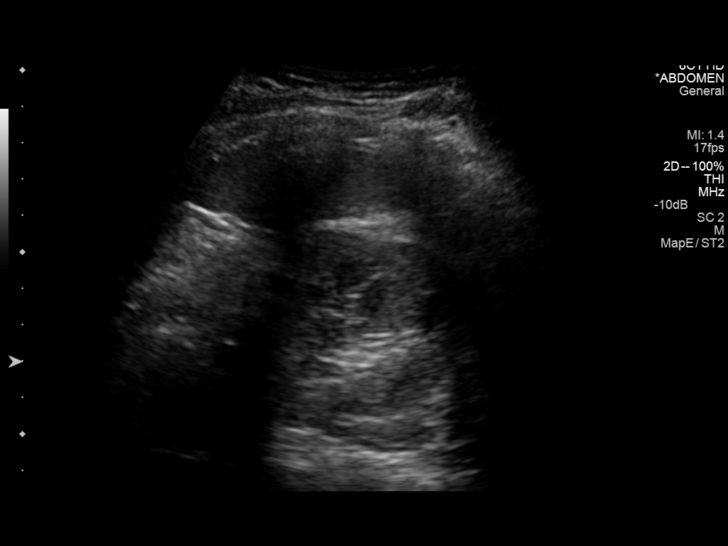
[im 74/74]
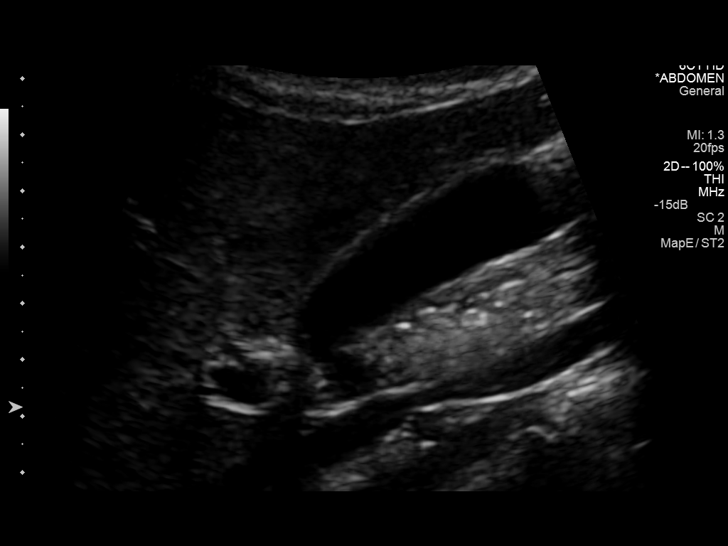

[14 of 25 positions shown; findings below may reference images not displayed]

FINDINGS: Gallbladder: No gallstones or wall thickening visualized. No
sonographic Murphy sign noted.

Common bile duct: Diameter: 4.9 mm, normal.

Liver: No focal lesion identified. Within normal limits in
parenchymal echogenicity.

IVC: No abnormality visualized.

Pancreas: Visualized portion unremarkable.

Spleen: 4.0 cm, normal.

Right Kidney: Length: 10.7 cm. Echogenicity within normal limits. No
mass or hydronephrosis visualized.

Left Kidney: Length: 11.0 cm. Echogenicity within normal limits. No
mass or hydronephrosis visualized.

Abdominal aorta: No aneurysm visualized.  2.0 cm maximal diameter.

Other findings: None.
IMPRESSION: Normal exam.

## 2015-10-24 ENCOUNTER — Encounter (HOSPITAL_COMMUNITY): Payer: Self-pay | Admitting: Emergency Medicine

## 2015-10-24 ENCOUNTER — Emergency Department (HOSPITAL_COMMUNITY)
Admission: EM | Admit: 2015-10-24 | Discharge: 2015-10-24 | Disposition: A | Discharge status: Home / Self Care | Payer: Managed Care, Other (non HMO) | Attending: Emergency Medicine | Admitting: Emergency Medicine

## 2015-10-24 DIAGNOSIS — Z791 Long term (current) use of non-steroidal anti-inflammatories (NSAID): Secondary | ICD-10-CM | POA: Insufficient documentation

## 2015-10-24 DIAGNOSIS — G43909 Migraine, unspecified, not intractable, without status migrainosus: Secondary | ICD-10-CM | POA: Diagnosis present

## 2015-10-24 DIAGNOSIS — Z79891 Long term (current) use of opiate analgesic: Secondary | ICD-10-CM | POA: Insufficient documentation

## 2015-10-24 DIAGNOSIS — G43809 Other migraine, not intractable, without status migrainosus: Secondary | ICD-10-CM

## 2015-10-24 DIAGNOSIS — Z79899 Other long term (current) drug therapy: Secondary | ICD-10-CM | POA: Insufficient documentation

## 2015-10-24 DIAGNOSIS — Z792 Long term (current) use of antibiotics: Secondary | ICD-10-CM | POA: Diagnosis not present

## 2015-10-24 DIAGNOSIS — Z793 Long term (current) use of hormonal contraceptives: Secondary | ICD-10-CM | POA: Diagnosis not present

## 2015-10-24 DIAGNOSIS — Z91010 Allergy to peanuts: Secondary | ICD-10-CM | POA: Insufficient documentation

## 2015-10-24 LAB — PREGNANCY, URINE: PREG TEST UR: NEGATIVE

## 2015-10-24 MED ORDER — BUTALBITAL-APAP-CAFFEINE 50-325-40 MG PO TABS: 1.0000 | ORAL_TABLET | Freq: Four times a day (QID) | ORAL | Status: AC | PRN

## 2015-10-24 MED ORDER — PROCHLORPERAZINE EDISYLATE 5 MG/ML IJ SOLN
10.0000 mg | Freq: Once | INTRAMUSCULAR | Status: AC
  Administered 2015-10-24: 10 mg via INTRAVENOUS
  Filled 2015-10-24: qty 2

## 2015-10-24 MED ORDER — KETOROLAC TROMETHAMINE 30 MG/ML IJ SOLN
30.0000 mg | Freq: Once | INTRAMUSCULAR | Status: AC
  Administered 2015-10-24: 30 mg via INTRAVENOUS
  Filled 2015-10-24: qty 1

## 2015-10-24 MED ORDER — DIPHENHYDRAMINE HCL 50 MG/ML IJ SOLN
25.0000 mg | Freq: Once | INTRAMUSCULAR | Status: AC
  Administered 2015-10-24: 25 mg via INTRAVENOUS
  Filled 2015-10-24: qty 1

## 2015-10-24 MED ORDER — SODIUM CHLORIDE 0.9 % IV BOLUS (SEPSIS)
1000.0000 mL | Freq: Once | INTRAVENOUS | Status: AC
  Administered 2015-10-24: 1000 mL via INTRAVENOUS

## 2015-10-24 MED ORDER — PROMETHAZINE HCL 25 MG PO TABS: 25.0000 mg | ORAL_TABLET | Freq: Four times a day (QID) | ORAL | Status: DC | PRN

## 2015-10-24 MED ORDER — BUTALBITAL-APAP-CAFFEINE 50-325-40 MG PO TABS
1.0000 | ORAL_TABLET | Freq: Once | ORAL | Status: AC
  Administered 2015-10-24: 1 via ORAL
  Filled 2015-10-24: qty 1

## 2015-10-24 NOTE — Discharge Instructions (Signed)
Return for worsening symptoms, including fever, vomiting   Migraine Headache A migraine headache is an intense, throbbing pain on one or both sides of your head. A migraine can last for 30 minutes to several hours. CAUSES  The exact cause of a migraine headache is not always known. However, a migraine may be caused when nerves in the brain become irritated and release chemicals that cause inflammation. This causes pain. Certain things may also trigger migraines, such as:  Alcohol.  Smoking.  Stress.  Menstruation.  Aged cheeses.  Foods or drinks that contain nitrates, glutamate, aspartame, or tyramine.  Lack of sleep.  Chocolate.  Caffeine.  Hunger.  Physical exertion.  Fatigue.  Medicines used to treat chest pain (nitroglycerine), birth control pills, estrogen, and some blood pressure medicines. SIGNS AND SYMPTOMS  Pain on one or both sides of your head.  Pulsating or throbbing pain.  Severe pain that prevents daily activities.  Pain that is aggravated by any physical activity.  Nausea, vomiting, or both.  Dizziness.  Pain with exposure to bright lights, loud noises, or activity.  General sensitivity to bright lights, loud noises, or smells. Before you get a migraine, you may get warning signs that a migraine is coming (aura). An aura may include:  Seeing flashing lights.  Seeing bright spots, halos, or zigzag lines.  Having tunnel vision or blurred vision.  Having feelings of numbness or tingling.  Having trouble talking.  Having muscle weakness. DIAGNOSIS  A migraine headache is often diagnosed based on:  Symptoms.  Physical exam.  A CT scan or MRI of your head. These imaging tests cannot diagnose migraines, but they can help rule out other causes of headaches. TREATMENT Medicines may be given for pain and nausea. Medicines can also be given to help prevent recurrent migraines.  HOME CARE INSTRUCTIONS  Only take over-the-counter or  prescription medicines for pain or discomfort as directed by your health care provider. The use of long-term narcotics is not recommended.  Lie down in a dark, quiet room when you have a migraine.  Keep a journal to find out what may trigger your migraine headaches. For example, write down:  What you eat and drink.  How much sleep you get.  Any change to your diet or medicines.  Limit alcohol consumption.  Quit smoking if you smoke.  Get 7-9 hours of sleep, or as recommended by your health care provider.  Limit stress.  Keep lights dim if bright lights bother you and make your migraines worse. SEEK IMMEDIATE MEDICAL CARE IF:   Your migraine becomes severe.  You have a fever.  You have a stiff neck.  You have vision loss.  You have muscular weakness or loss of muscle control.  You start losing your balance or have trouble walking.  You feel faint or pass out.  You have severe symptoms that are different from your first symptoms. MAKE SURE YOU:   Understand these instructions.  Will watch your condition.  Will get help right away if you are not doing well or get worse.   This information is not intended to replace advice given to you by your health care provider. Make sure you discuss any questions you have with your health care provider.   Document Released: 06/05/2005 Document Revised: 06/26/2014 Document Reviewed: 02/10/2013 Elsevier Interactive Patient Education [employer]2016 [employer]

## 2015-10-24 NOTE — ED Provider Notes (Signed)
CSN: [id number]     Arrival date & time 10/24/15  1221 History   First MD Initiated Contact with Patient 10/24/15 1256     Chief Complaint  Patient presents with  . Migraine  . Photophobia  . Emesis     (Consider location/radiation/quality/duration/timing/severity/associated sxs/prior Treatment) HPI 25 year old female who presents with migraine headache. States history of migraine headaches and near daily headaches Which is normal for her. States that 2 days ago she has had persistent headache. Initially started as her typical headache I frontal and associated with nausea. States that over the past 24 hours has gradually gotten worse and now primarily involves the right side of her face and behind her right eye. She has had nausea with intermittent vomiting, photophobia, and phonophobia. States that her allergies have been very severe recently which is a trigger for her migraine headaches.  History reviewed. No pertinent past medical history. History reviewed. No pertinent past surgical history. History reviewed. No pertinent family history. Social History  Substance Use Topics  . Smoking status: Never Smoker   . Smokeless tobacco: Never Used  . Alcohol Use: Yes     Comment: rarely   OB History    No data available     Review of Systems 10/14 systems reviewed and are negative other than those stated in the HPI   Allergies  Peanut-containing drug products  Home Medications   Prior to Admission medications   Medication Sig Start Date End Date Taking? Authorizing Provider  diphenhydrAMINE (ALLERGY RELIEF) 25 MG tablet Take 25 mg by mouth every 6 (six) hours as needed for allergies (allergy related headache).    Yes Historical Provider, MD  Levonorgestrel-Ethinyl Estradiol (CAMRESE) 0.15-0.03 &0.01 MG tablet Take 1 tablet by mouth every morning.   Yes Historical Provider, MD  Pseudoephedrine-Ibuprofen 30-200 MG TABS Take 1 tablet by mouth as needed (headache).   Yes Historical  Provider, MD  acetaminophen-codeine (TYLENOL #3) 300-30 MG per tablet Take 1 tablet by mouth every 6 (six) hours as needed for moderate pain. Patient not taking: Reported on 07/29/2014 03/16/14   Mercedes Camprubi-Soms, PA-C  amoxicillin-clavulanate (AUGMENTIN) 875-125 MG per tablet Take 1 tablet by mouth 2 (two) times daily. Patient not taking: Reported on 07/29/2014 04/17/14   Elson AreasLeslie K Sofia, PA-C  azithromycin (ZITHROMAX) 250 MG tablet Take 1 tablet (250 mg total) by mouth daily. Take first 2 tablets together, then 1 every day until finished. Patient not taking: Reported on 02/03/2015 11/25/14   Elpidio AnisShari Upstill, PA-C  butalbital-acetaminophen-caffeine (FIORICET) [phone] MG tablet Take 1-2 tablets by mouth every 6 (six) hours as needed for headache. 10/24/15 10/23/16  Lavera Guiseana Duo Shlok Raz, MD  dicyclomine (BENTYL) 20 MG tablet Take 1 tablet (20 mg total) by mouth 2 (two) times daily. Patient not taking: Reported on 02/03/2015 07/29/14   Arthor CaptainAbigail Harris, PA-C  esomeprazole (NEXIUM) 20 MG capsule Take 1 capsule (20 mg total) by mouth daily at 12 noon. Patient not taking: Reported on 02/03/2015 07/29/14   Arthor CaptainAbigail Harris, PA-C  naproxen (NAPROSYN) 500 MG tablet Take 1 tablet (500 mg total) by mouth 2 (two) times daily as needed for mild pain, moderate pain or headache (TAKE WITH MEALS.). Patient not taking: Reported on 07/29/2014 03/16/14   Mercedes Camprubi-Soms, PA-C  promethazine (PHENERGAN) 25 MG tablet Take 1 tablet (25 mg total) by mouth every 6 (six) hours as needed for nausea or vomiting. 10/24/15   Lavera Guiseana Duo Frederik Standley, MD  pseudoephedrine (SUDAFED) 30 MG tablet Take 30 mg by mouth  every 4 (four) hours as needed for congestion. Reported on 10/24/2015    Historical Provider, MD  sucralfate (CARAFATE) 1 G tablet Take 1 tablet (1 g total) by mouth 4 (four) times daily -  with meals and at bedtime. Patient not taking: Reported on 02/03/2015 07/29/14   Arthor Captain, PA-C   BP 104/77 mmHg  Pulse 63  Temp(Src) 98.6 F (37 C)  (Oral)  Resp 16  SpO2 94% Physical Exam Physical Exam  Nursing note and vitals reviewed. Constitutional: Well developed, well nourished, non-toxic, and in no acute distress Head: Normocephalic and atraumatic.  Mouth/Throat: Oropharynx is clear and moist.  Neck: Normal range of motion. Neck supple. No meningismus Cardiovascular: Normal rate and regular rhythm.   Pulmonary/Chest: Effort normal and breath sounds normal.  Abdominal: Soft. There is no tenderness. There is no rebound and no guarding.  Musculoskeletal: Normal range of motion.  Neurological: Alert, no facial droop, fluent speech, moves all extremities symmetrically, EOMI, PERRL, sensation to light touch in tact throughout Skin: Skin is warm and dry.  Psychiatric: Cooperative  ED Course  Procedures (including critical care time) Labs Review Labs Reviewed  PREGNANCY, URINE    Imaging Review No results found. I have personally reviewed and evaluated these images and lab results as part of my medical decision-making.   EKG Interpretation None      MDM   Final diagnoses:  Other migraine without status migrainosus, not intractable    25 year old female who presents with headache consistent with that of her migraine headaches. Presentation she is well-appearing in no distress. Her vital signs are normal. She has a normal neurological exam. No concerning features that would be concerning for infection, mass, or bleeding. Given migraine cocktail and feels symptomatically improved. States that she is improved for discharge home. Strict return and follow-up instructions reviewed. She expressed understanding of all discharge instructions and felt comfortable with the plan of care.    Lavera Guise, MD 10/24/15 [phone]

## 2015-10-24 NOTE — ED Notes (Signed)
Pt cannot use restroom at this time, aware urine specimen is needed.  

## 2015-10-24 NOTE — ED Notes (Signed)
MD at bedside. 

## 2015-10-24 NOTE — ED Notes (Signed)
Discharge instructions, follow up care, and rx x2 reviewed with patient. Patient verbalized understanding. 

## 2015-10-24 NOTE — ED Notes (Signed)
Hx of migraines. States this migraine is worse than it's ever been, states last night it made her vomit. Denies neck pain. Is c/o photophobia and sensativity to sound. Frontal headache.

## 2016-01-27 ENCOUNTER — Encounter (HOSPITAL_COMMUNITY): Payer: Self-pay | Admitting: *Deleted

## 2016-01-27 ENCOUNTER — Emergency Department (HOSPITAL_COMMUNITY)
Admission: EM | Admit: 2016-01-27 | Discharge: 2016-01-27 | Disposition: A | Discharge status: Home / Self Care | Payer: Managed Care, Other (non HMO) | Attending: Emergency Medicine | Admitting: Emergency Medicine

## 2016-01-27 DIAGNOSIS — Z79899 Other long term (current) drug therapy: Secondary | ICD-10-CM | POA: Insufficient documentation

## 2016-01-27 DIAGNOSIS — Z792 Long term (current) use of antibiotics: Secondary | ICD-10-CM | POA: Diagnosis not present

## 2016-01-27 DIAGNOSIS — K0889 Other specified disorders of teeth and supporting structures: Secondary | ICD-10-CM | POA: Insufficient documentation

## 2016-01-27 DIAGNOSIS — Z91010 Allergy to peanuts: Secondary | ICD-10-CM | POA: Insufficient documentation

## 2016-01-27 MED ORDER — PENICILLIN V POTASSIUM 500 MG PO TABS: 500.0000 mg | ORAL_TABLET | Freq: Two times a day (BID) | ORAL | 0 refills | Status: AC

## 2016-01-27 MED ORDER — IBUPROFEN 600 MG PO TABS: 600.0000 mg | ORAL_TABLET | Freq: Four times a day (QID) | ORAL | 0 refills | Status: DC | PRN

## 2016-01-27 MED ORDER — HYDROCODONE-ACETAMINOPHEN 5-325 MG PO TABS: 1.0000 | ORAL_TABLET | ORAL | 0 refills | Status: DC | PRN

## 2016-01-27 NOTE — ED Provider Notes (Signed)
[facility]-EMER[facility]Y DEPT Provider Note   CSN: [id number] Arrival date & time: 01/27/16  2023  First Provider Contact:   First MD Initiated Contact with Patient 01/27/16 2047        By signing my name below, I, Octavia Heirrianna Nassar, attest that this documentation has been prepared under the direction and in the presence of Melburn HakeNicole Lakeisha Waldrop, PA-C.  Electronically Signed: Octavia HeirArianna Nassar, ED Scribe. 01/27/16. 8:58 PM.     History   Chief Complaint Chief Complaint  Patient presents with  . Dental Pain     The history is provided by the patient. No language interpreter was used.   HPI Comments: Julie Mason is a 25 y.o. female who presents to the Emergency Department complaining of sudden onset, gradual worsening, moderate, left upper and lower dental pain onset one hour ago. Pt reports having decayed teeth on both top and bottom. She says she has taken ibuprofen to alleviate her pain with relief. Pt has not seen a dentist for her current problem. Denies fever, drainage, difficulty swallowing, shortness of breath, or vomiting.   History reviewed. No pertinent past medical history.  There are no active problems to display for this patient.   History reviewed. No pertinent surgical history.  OB History    No data available       Home Medications    Prior to Admission medications   Medication Sig Start Date End Date Taking? Authorizing Provider  acetaminophen-codeine (TYLENOL #3) 300-30 MG per tablet Take 1 tablet by mouth every 6 (six) hours as needed for moderate pain. Patient not taking: Reported on 07/29/2014 03/16/14   Mercedes Camprubi-Soms, PA-C  amoxicillin-clavulanate (AUGMENTIN) 875-125 MG per tablet Take 1 tablet by mouth 2 (two) times daily. Patient not taking: Reported on 07/29/2014 04/17/14   Elson AreasLeslie K Sofia, PA-C  azithromycin (ZITHROMAX) 250 MG tablet Take 1 tablet (250 mg total) by mouth daily. Take first 2 tablets together, then 1 every day until finished. Patient not  taking: Reported on 02/03/2015 11/25/14   Elpidio AnisShari Upstill, PA-C  butalbital-acetaminophen-caffeine (FIORICET) 909-766-519850-325-40 MG tablet Take 1-2 tablets by mouth every 6 (six) hours as needed for headache. 10/24/15 10/23/16  Lavera Guiseana Duo Liu, MD  dicyclomine (BENTYL) 20 MG tablet Take 1 tablet (20 mg total) by mouth 2 (two) times daily. Patient not taking: Reported on 02/03/2015 07/29/14   Arthor CaptainAbigail Harris, PA-C  diphenhydrAMINE (ALLERGY RELIEF) 25 MG tablet Take 25 mg by mouth every 6 (six) hours as needed for allergies (allergy related headache).     Historical Provider, MD  esomeprazole (NEXIUM) 20 MG capsule Take 1 capsule (20 mg total) by mouth daily at 12 noon. Patient not taking: Reported on 02/03/2015 07/29/14   Arthor CaptainAbigail Harris, PA-C  HYDROcodone-acetaminophen (NORCO/VICODIN) 5-325 MG tablet Take 1 tablet by mouth every 4 (four) hours as needed. 01/27/16   Barrett HenleNicole Elizabeth Kiara Keep, PA-C  ibuprofen (ADVIL,MOTRIN) 600 MG tablet Take 1 tablet (600 mg total) by mouth every 6 (six) hours as needed. 01/27/16   Barrett HenleNicole Elizabeth Emiliano Welshans, PA-C  Levonorgestrel-Ethinyl Estradiol (CAMRESE) 0.15-0.03 &0.01 MG tablet Take 1 tablet by mouth every morning.    Historical Provider, MD  naproxen (NAPROSYN) 500 MG tablet Take 1 tablet (500 mg total) by mouth 2 (two) times daily as needed for mild pain, moderate pain or headache (TAKE WITH MEALS.). Patient not taking: Reported on 07/29/2014 03/16/14   Mercedes Camprubi-Soms, PA-C  penicillin v potassium (VEETID) 500 MG tablet Take 1 tablet (500 mg total) by mouth 2 (two) times daily. 01/27/16 02/03/16  Satira Sark Trashaun Streight, PA-C  promethazine (PHENERGAN) 25 MG tablet Take 1 tablet (25 mg total) by mouth every 6 (six) hours as needed for nausea or vomiting. 10/24/15   Lavera Guise, MD  pseudoephedrine (SUDAFED) 30 MG tablet Take 30 mg by mouth every 4 (four) hours as needed for congestion. Reported on 10/24/2015    Historical Provider, MD  Pseudoephedrine-Ibuprofen 30-200 MG TABS Take 1 tablet by  mouth as needed (headache).    Historical Provider, MD  sucralfate (CARAFATE) 1 G tablet Take 1 tablet (1 g total) by mouth 4 (four) times daily -  with meals and at bedtime. Patient not taking: Reported on 02/03/2015 07/29/14   Arthor Captain, PA-C    Family History No family history on file.  Social History Social History  Substance Use Topics  . Smoking status: Never Smoker  . Smokeless tobacco: Never Used  . Alcohol use Yes     Comment: rarely     Allergies   Peanut-containing drug products   Review of Systems Review of Systems  Constitutional: Negative for fever.  HENT: Positive for dental problem and facial swelling. Negative for trouble swallowing.   Respiratory: Negative for shortness of breath.   Gastrointestinal: Negative for nausea and vomiting.     Physical Exam Updated Vital Signs BP 108/74   Pulse 65   Temp 98.3 F (36.8 C)   Resp 16   LMP 01/04/2016   SpO2 97%   Physical Exam  Constitutional: She is oriented to person, place, and time. She appears well-developed and well-nourished.  HENT:  Head: Normocephalic and atraumatic.  Mouth/Throat: Uvula is midline, oropharynx is clear and moist and mucous membranes are normal. No trismus in the jaw. Abnormal dentition. Dental caries present. No dental abscesses. No oropharyngeal exudate, posterior oropharyngeal edema, posterior oropharyngeal erythema or tonsillar abscesses.    No tongue swelling or facial swelling.  Airway patent. Patient tolerating secretions without difficulty.   Tooth #15 cracked. Mild TTP over surrounding gingiva of tooth # 15 and #18. No swelling, induration or drainage noted.  Eyes: Conjunctivae and EOM are normal. Right eye exhibits no discharge. Left eye exhibits no discharge. No scleral icterus.  Neck: Normal range of motion. Neck supple.  No evidence of Ludwigs angina. No facial or neck swelling.  Cardiovascular: Normal rate.   Pulmonary/Chest: Effort normal. No respiratory  distress.  Abdominal: Bowel sounds are normal. She exhibits no distension.  Musculoskeletal: Normal range of motion.  Moves all extremities spontaneously.  Lymphadenopathy:    She has no cervical adenopathy.  Neurological: She is alert and oriented to person, place, and time.  Speech clear without dysarthria.   Skin: Skin is warm and dry.  Nursing note and vitals reviewed.    ED Treatments / Results  DIAGNOSTIC STUDIES: Oxygen Saturation is 97% on RA, normal by my interpretation.  COORDINATION OF CARE:  8:56 PM Discussed treatment plan which includes penicillin and anti-inflammatories with pt at bedside and pt agreed to plan.  Labs (all labs ordered are listed, but only abnormal results are displayed) Labs Reviewed - No data to display  EKG  EKG Interpretation None       Radiology No results found.  Procedures Procedures (including critical care time)  Medications Ordered in ED Medications - No data to display   Initial Impression / Assessment and Plan / ED Course  I have reviewed the triage vital signs and the nursing notes.  Pertinent labs & imaging results that were available during my care  of the patient were reviewed by me and considered in my medical decision making (see chart for details).  Clinical Course    Patient with toothache.  No gross abscess.  Exam unconcerning for Ludwig's angina or spread of infection.  Will treat with penicillin and pain medicine.  Urged patient to follow-up with dentist.     Final Clinical Impressions(s) / ED Diagnoses   Final diagnoses:  Pain, dental    New Prescriptions Discharge Medication List as of 01/27/2016  9:04 PM    START taking these medications   Details  HYDROcodone-acetaminophen (NORCO/VICODIN) 5-325 MG tablet Take 1 tablet by mouth every 4 (four) hours as needed., Starting Thu 01/27/2016, Print    ibuprofen (ADVIL,MOTRIN) 600 MG tablet Take 1 tablet (600 mg total) by mouth every 6 (six) hours as needed.,  Starting Thu 01/27/2016, Print    penicillin v potassium (VEETID) 500 MG tablet Take 1 tablet (500 mg total) by mouth 2 (two) times daily., Starting Thu 01/27/2016, Until Thu 02/03/2016, Print       I personally performed the services described in this documentation, which was scribed in my presence. The recorded information has been reviewed and is accurate.    Satira Sark [place], [place] 01/27/16 2109    Azalia Bilis, MD 01/27/16 [phone]

## 2016-01-27 NOTE — Discharge Instructions (Signed)
Take your medication as prescribed as needed for pain relief. Take your antibiotics as prescribed until he has completed the prescription. You may also apply ice to affected area for 15 minutes 3-4 times daily for pain relief. Follow-up with one of the dental clinics listed below for further management of your dental pain. Return to the emergency department if symptoms worsen or new onset of fever, facial/neck swelling, unable to swallow, difficulty breathing.  Dental Care: Organization         Address  Phone  Notes  [facility] [address], [place] [phone] Accepts children up to age 24 who are enrolled in [place] or [place] Health Choice; pregnant women with a Medicaid card; and children who have applied for Medicaid or [place] Health Choice, but were declined, whose parents can pay a reduced fee at time of service.  [facility] [place]  [address], [place] [phone] Accepts children up to age 24 who are enrolled in [place] or [place] Health Choice; pregnant women with a Medicaid card; and children who have applied for Medicaid or [place] Health Choice, but were declined, whose parents can pay a reduced fee at time of service.  [facility]  [address], [place] [phone] Patients are seen by appointment only. Walk-ins are not accepted. [facility] will see patients 24 years of age and older. Monday - Tuesday (8am-5pm) Most Wednesdays (8:30-5pm) $30 per visit, cash only  [facility]  [address], [facility] [place] [phone] Patients are seen by appointment only. Walk-ins are not accepted. [facility] will see patients 24 years of age and older. One Wednesday Evening (Monthly: Volunteer Based).  $30 per visit, cash only  [facility]  [phone] for adults; Children under age 684, call Graduate  Pediatric Dentistry at [phone]. Children aged 194-14, please call [phone] to request a pediatric application.  Dental services are provided in all areas of dental care including fillings, crowns and bridges, complete and partial dentures, implants, gum treatment, root canals, and extractions. Preventive care is also provided. Treatment is provided to both adults and children. Patients are selected via a lottery and there is often a waiting list.   [facility] [address], [place]  [phone] [email or web address]   [facility] [address], [place], [place] [phone], Ext. [phone] Second and Fourth Thursday of each month, opens at 6:30 AM; Clinic ends at 9 AM.  Patients are seen on a first-come first-served basis, and a limited number are seen during each clinic.   [facility]  [address], [place], [place] [phone]   Eligibility Requirements You must have lived in [place], [place], or [place] counties for at least the last three months.   You cannot be eligible for state or federal sponsored [place]healthcare insurance, including [employer]ans [employer], [place]icaid, or [employer]icare.   You generally cannot be eligible for healthcare insurance through your employer.    How to apply: Eligibility screenings are held every Tuesday and Wednesday afternoon from 1:00 pm until 4:00 pm. You do not need an appointment for the interview!  [facility] [address], [place], [place] [phone]   [facility]  [phone]   [facility]  [phone]   [facility]  [phone]  [facility] Community Service [facility] [address] [place], [place] [address] Phone [phone]  [employer] [facility] in [place] exemplifies the Dental School?s vision to improve the health and quality of life of all [place]ians by Public house manager with a passion to care for the underserved and by leading the nation in community-based, service learning oral health education.  We are committed to offering comprehensive general dental services for adults, children and special needs patients in a safe, caring and professional setting.   Appointments: Our clinic is open Monday through Friday 8:00 a.m. until 5:00 p.m. The amount of time scheduled for an appointment depends on the patient?s specific needs. We ask that you keep your appointed time for care or provide 24-hour notice of all appointment changes. Parents or legal guardians must accompany minor children.   Payment for Services: Medicaid and other insurance plans are welcome. Payment for services is due when services are rendered and may be made by cash or credit card. If you have dental insurance, we will assist you with your claim submission.    Emergencies:  Emergency services will be provided Monday through Friday on a walk-in basis.  Please arrive early for emergency services. After hours emergency services will be provided for patients of record as required.   Services:  Comprehensive General Dentistry Children?s Dentistry Oral Surgery - Extractions Root Canals Sealants and Tooth Colored Fillings Crowns and Ship broker 3-D/[facility]

## 2016-01-27 NOTE — ED Triage Notes (Signed)
Pt states that she has had a toothache today to the left lower mouth; pt states that the pain got more intense in the last 30 min and is making her dizzy

## 2016-01-27 NOTE — ED Notes (Signed)
Pt given discharge instructions including prescriptions and f/u appointments. Pt states understanding. Pt states receipt of all belongings.   

## 2016-07-19 ENCOUNTER — Emergency Department (HOSPITAL_COMMUNITY)
Admission: EM | Admit: 2016-07-19 | Discharge: 2016-07-19 | Disposition: A | Discharge status: Home / Self Care | Payer: Managed Care, Other (non HMO) | Attending: Emergency Medicine | Admitting: Emergency Medicine

## 2016-07-19 ENCOUNTER — Encounter (HOSPITAL_COMMUNITY): Payer: Self-pay

## 2016-07-19 DIAGNOSIS — R05 Cough: Secondary | ICD-10-CM | POA: Diagnosis present

## 2016-07-19 DIAGNOSIS — R52 Pain, unspecified: Secondary | ICD-10-CM | POA: Insufficient documentation

## 2016-07-19 DIAGNOSIS — Z91010 Allergy to peanuts: Secondary | ICD-10-CM | POA: Diagnosis not present

## 2016-07-19 DIAGNOSIS — Z79899 Other long term (current) drug therapy: Secondary | ICD-10-CM | POA: Diagnosis not present

## 2016-07-19 DIAGNOSIS — R6889 Other general symptoms and signs: Secondary | ICD-10-CM

## 2016-07-19 MED ORDER — OSELTAMIVIR PHOSPHATE 75 MG PO CAPS: 75.0000 mg | ORAL_CAPSULE | Freq: Two times a day (BID) | ORAL | 0 refills | Status: DC

## 2016-07-19 NOTE — ED Triage Notes (Signed)
Pt presents with c/o flu-like symptoms. Pt reports she has body aches, a cough, nasal congestion. Pt reports her symptoms started this morning. Pt denies any NVD. 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 Pt is afebrile at this time.

## 2016-07-19 NOTE — ED Provider Notes (Signed)
[facility] Provider Note   CSN: [id number] Arrival date & time: 07/19/16  1439   By signing my name below, I, Teofilo Pod, attest that this documentation has been prepared under the direction and in the presence of Nanna Ertle, PA-C. Electronically Signed: Teofilo Pod, ED Scribe. 07/19/2016. 4:26 PM.   History   Chief Complaint Chief Complaint  Patient presents with  . Generalized Body Aches  . Sore Throat  . Cough    The history is provided by the patient. No language interpreter was used.   HPI Comments:  Julie Mason is a 27 y.o. female who presents to the Emergency Department complaining of constant generalized body aches since this AM. Pt complains of associated cough, nasal congestion, sore throat, chills. Pt states that her left nostril feels like it is burning. Pt reports sick contact at work. Pt got a flu shot 2 months ago. No alleviating factors noted. Pt denies nausea, vomiting, diarrhea, fever.  History reviewed. No pertinent past medical history.  There are no active problems to display for this patient.   History reviewed. No pertinent surgical history.  OB History    No data available       Home Medications    Prior to Admission medications   Medication Sig Start Date End Date Taking? Authorizing Provider  acetaminophen-codeine (TYLENOL #3) 300-30 MG per tablet Take 1 tablet by mouth every 6 (six) hours as needed for moderate pain. Patient not taking: Reported on 07/29/2014 03/16/14   [facility] [address], PA-C  amoxicillin-clavulanate (AUGMENTIN) 875-125 MG per tablet Take 1 tablet by mouth 2 (two) times daily. Patient not taking: Reported on 07/29/2014 04/17/14   Elson Areas, PA-C  azithromycin (ZITHROMAX) 250 MG tablet Take 1 tablet (250 mg total) by mouth daily. Take first 2 tablets together, then 1 every day until finished. Patient not taking: Reported on 02/03/2015 11/25/14   Elpidio Anis, PA-C    butalbital-acetaminophen-caffeine (FIORICET) [id number] MG tablet Take 1-2 tablets by mouth every 6 (six) hours as needed for headache. 10/24/15 10/23/16  Lavera Guise, MD  dicyclomine (BENTYL) 20 MG tablet Take 1 tablet (20 mg total) by mouth 2 (two) times daily. Patient not taking: Reported on 02/03/2015 07/29/14   Arthor Captain, PA-C  diphenhydrAMINE (ALLERGY RELIEF) 25 MG tablet Take 25 mg by mouth every 6 (six) hours as needed for allergies (allergy related headache).     Historical Provider, MD  esomeprazole (NEXIUM) 20 MG capsule Take 1 capsule (20 mg total) by mouth daily at 12 noon. Patient not taking: Reported on 02/03/2015 07/29/14   Arthor Captain, PA-C  HYDROcodone-acetaminophen (NORCO/VICODIN) 5-325 MG tablet Take 1 tablet by mouth every 4 (four) hours as needed. 01/27/16   Barrett Henle, PA-C  ibuprofen (ADVIL,MOTRIN) 600 MG tablet Take 1 tablet (600 mg total) by mouth every 6 (six) hours as needed. 01/27/16   Barrett Henle, PA-C  Levonorgestrel-Ethinyl Estradiol (CAMRESE) 0.15-0.03 &0.01 MG tablet Take 1 tablet by mouth every morning.    Historical Provider, MD  naproxen (NAPROSYN) 500 MG tablet Take 1 tablet (500 mg total) by mouth 2 (two) times daily as needed for mild pain, moderate pain or headache (TAKE WITH MEALS.). Patient not taking: Reported on 07/29/2014 03/16/14   [address] Street, PA-C  promethazine (PHENERGAN) 25 MG tablet Take 1 tablet (25 mg total) by mouth every 6 (six) hours as needed for nausea or vomiting. 10/24/15   Lavera Guise, MD  pseudoephedrine (SUDAFED) 30 MG tablet Take 30  mg by mouth every 4 (four) hours as needed for congestion. Reported on 10/24/2015    Historical Provider, MD  Pseudoephedrine-Ibuprofen 30-200 MG TABS Take 1 tablet by mouth as needed (headache).    Historical Provider, MD  sucralfate (CARAFATE) 1 G tablet Take 1 tablet (1 g total) by mouth 4 (four) times daily -  with meals and at bedtime. Patient not taking: Reported on 02/03/2015  07/29/14   Arthor Captain, PA-C    Family History No family history on file.  Social History Social History  Substance Use Topics  . Smoking status: Never Smoker  . Smokeless tobacco: Never Used  . Alcohol use Yes     Comment: rarely     Allergies   Peanut-containing drug products   Review of Systems Review of Systems  Constitutional: Positive for chills. Negative for fever.  HENT: Positive for congestion and sore throat.   Respiratory: Positive for cough.   Gastrointestinal: Negative for diarrhea, nausea and vomiting.  Musculoskeletal: Positive for myalgias.     Physical Exam Updated Vital Signs BP 106/73 (BP Location: Left Arm)   Pulse 73   Temp 98.8 F (37.1 C) (Oral)   Resp 18   Ht 5\' 5"  (1.651 m)   Wt 170 lb (77.1 kg)   LMP 04/20/2016 (Approximate)   SpO2 100%   BMI 28.29 kg/m   Physical Exam  Constitutional: She is oriented to person, place, and time. She appears well-developed and well-nourished. No distress.  HENT:  Head: Normocephalic and atraumatic.  Right Ear: Tympanic membrane, external ear and ear canal normal.  Left Ear: Tympanic membrane, external ear and ear canal normal.  Nose: Mucosal edema and rhinorrhea present.  Mouth/Throat: Uvula is midline and mucous membranes are normal. Posterior oropharyngeal erythema present. No oropharyngeal exudate, posterior oropharyngeal edema or tonsillar abscesses.  Eyes: Conjunctivae are normal.  Neck: Neck supple.  Cardiovascular: Normal rate, regular rhythm, normal heart sounds and intact distal pulses.   Pulmonary/Chest: Effort normal and breath sounds normal. No respiratory distress. She has no wheezes. She has no rales.  Abdominal: She exhibits no distension.  Musculoskeletal: Normal range of motion.  Neurological: She is alert and oriented to person, place, and time.  Skin: Skin is warm and dry.  Psychiatric: She has a normal mood and affect.  Nursing note and vitals reviewed.    ED Treatments /  Results  DIAGNOSTIC STUDIES:  Oxygen Saturation is 100% on RA, normal by my interpretation.    COORDINATION OF CARE:  4:24 PM Recommended to pt to take tylenol and motrin. Will prescribe tamiflu. Discussed treatment plan with pt at bedside and pt agreed to plan.   Labs (all labs ordered are listed, but only abnormal results are displayed) Labs Reviewed - No data to display  EKG  EKG Interpretation None       Radiology No results found.  Procedures Procedures (including critical care time)  Medications Ordered in ED Medications - No data to display   Initial Impression / Assessment and Plan / ED Course  I have reviewed the triage vital signs and the nursing notes.  Pertinent labs & imaging results that were available during my care of the patient were reviewed by me and considered in my medical decision making (see chart for details).    Patient in emergency department with flulike symptoms onset just a few hours ago. She is worried that she may have the flu, however at this time there is no fever, she does have some nasal  congestion and sore throat. Minimal cough. Her vital signs are normal. I explained to her we don't do flu testing, which is the reason she came to the ED. I will give her Tamiflu by her request, but instructed to not fill unless she started running a fever tonight. She agrees. Otherwise symptomatic treatment. I do not think she needs any imaging or labs at this time. She is stable for discharge home with outpatient follow-up.  Vitals:   07/19/16 1455 07/19/16 1501 07/19/16 1644  BP: 106/73  106/66  Pulse: 73  70  Resp: 18  18  Temp: 98.8 F (37.1 C)  98.5 F (36.9 C)  TempSrc: Oral  Oral  SpO2: 100%  99%  Weight:  77.1 kg   Height:  5\' 5"  (1.651 m)      Final Clinical Impressions(s) / ED Diagnoses   Final diagnoses:  Flu-like symptoms    New Prescriptions Discharge Medication List as of 07/19/2016  4:33 PM    START taking these  medications   Details  oseltamivir (TAMIFLU) 75 MG capsule Take 1 capsule (75 mg total) by mouth every 12 (twelve) hours., Starting Wed 07/19/2016, Print       I personally performed the services described in this documentation, which was scribed in my presence. The recorded information has been reviewed and is accurate.     Jaynie Crumbleatyana Jayveon Convey, PA-C 07/19/16 1956    Lavera Guiseana Duo Liu, MD 07/20/16 2030

## 2016-07-19 NOTE — Discharge Instructions (Signed)
Take ibuprofen or tylenol for body aches and fever. Take afrin, or saline nasal spray for congestion. If start running high fever or symptoms worse, take tamiflu as prescribed. Follow up with primary care doctor. Return if worsening.

## 2017-03-23 ENCOUNTER — Encounter (HOSPITAL_COMMUNITY): Payer: Self-pay | Admitting: Emergency Medicine

## 2017-03-23 ENCOUNTER — Emergency Department (HOSPITAL_COMMUNITY)
Admission: EM | Admit: 2017-03-23 | Discharge: 2017-03-23 | Disposition: A | Discharge status: Home / Self Care | Payer: PRIVATE HEALTH INSURANCE | Attending: Emergency Medicine | Admitting: Emergency Medicine

## 2017-03-23 DIAGNOSIS — Z79899 Other long term (current) drug therapy: Secondary | ICD-10-CM | POA: Diagnosis not present

## 2017-03-23 DIAGNOSIS — R109 Unspecified abdominal pain: Secondary | ICD-10-CM | POA: Diagnosis present

## 2017-03-23 DIAGNOSIS — Z91010 Allergy to peanuts: Secondary | ICD-10-CM | POA: Diagnosis not present

## 2017-03-23 LAB — COMPREHENSIVE METABOLIC PANEL
ALT: 16 U/L (ref 14–54)
ANION GAP: 9 (ref 5–15)
AST: 18 U/L (ref 15–41)
Albumin: 3.8 g/dL (ref 3.5–5.0)
Alkaline Phosphatase: 54 U/L (ref 38–126)
BILIRUBIN TOTAL: 0.4 mg/dL (ref 0.3–1.2)
BUN: 11 mg/dL (ref 6–20)
CHLORIDE: 100 mmol/L — AB (ref 101–111)
CO2: 28 mmol/L (ref 22–32)
Calcium: 9 mg/dL (ref 8.9–10.3)
Creatinine, Ser: 0.81 mg/dL (ref 0.44–1.00)
Glucose, Bld: 98 mg/dL (ref 65–99)
POTASSIUM: 3.8 mmol/L (ref 3.5–5.1)
Sodium: 137 mmol/L (ref 135–145)
TOTAL PROTEIN: 7.5 g/dL (ref 6.5–8.1)

## 2017-03-23 LAB — LIPASE, BLOOD: LIPASE: 23 U/L (ref 11–51)

## 2017-03-23 LAB — CBC
HEMATOCRIT: 38.1 % (ref 36.0–46.0)
Hemoglobin: 12.6 g/dL (ref 12.0–15.0)
MCH: 28 pg (ref 26.0–34.0)
MCHC: 33.1 g/dL (ref 30.0–36.0)
MCV: 84.7 fL (ref 78.0–100.0)
Platelets: 248 10*3/uL (ref 150–400)
RBC: 4.5 MIL/uL (ref 3.87–5.11)
RDW: 12.8 % (ref 11.5–15.5)
WBC: 6.8 10*3/uL (ref 4.0–10.5)

## 2017-03-23 LAB — I-STAT BETA HCG BLOOD, ED (MC, WL, AP ONLY): I-stat hCG, quantitative: <5 m[IU]/mL (ref <5)

## 2017-03-23 MED ORDER — ONDANSETRON HCL 4 MG PO TABS: 4.0000 mg | ORAL_TABLET | Freq: Four times a day (QID) | ORAL | 0 refills | Status: DC

## 2017-03-23 NOTE — ED Triage Notes (Signed)
Patient c/o abdominal cramping with nausea x2 weeks. Denies vomiting and diarrhea. Ambulatory.

## 2017-03-23 NOTE — Discharge Instructions (Signed)
Please read attached information. If you experience any new or worsening signs or symptoms please return to the emergency room for evaluation. Please follow-up with your primary care provider or specialist as discussed.  °

## 2017-03-23 NOTE — ED Provider Notes (Signed)
[facility]-EMERGENCY DEPT Provider Note   CSN: [id number] Arrival date & time: 03/23/17  1954     History   Chief Complaint Chief Complaint  Patient presents with  . Abdominal Pain    HPI Julie Mason is a 26 y.o. female.  HPI   26 year old female presents today with numerous complaints.  Patient notes that she has had difficulty finding a birth control that does not cause severe PMS symptoms.  She notes abdominal cramping and feeling tired.  She notes the symptoms are similar to previous episodes where she was on a different birth control.  She notes for the last 2 weeks she has been having the symptoms, notes that she recently stopped birth control approximately 1.5 months ago.  Patient is presently she is on her current menstrual cycle, reports she is having very minor abdominal cramping which is typical of her cycles.  She denies any fever, vomiting, reports some nausea, denies any change in urine or bowel habits.  No weight loss or weight gain, no night sweats.  Patient additionally notes she is eating more healthy recently and eating numerous solids. History reviewed. No pertinent past medical history.  There are no active problems to display for this patient.   History reviewed. No pertinent surgical history.  OB History    No data available       Home Medications    Prior to Admission medications   Medication Sig Start Date End Date Taking? Authorizing Provider  BIOTIN PO Take 1 tablet by mouth daily.   Yes [provider]  Multiple Vitamins-Minerals (MULTIVITAMIN ADULT) TABS Take 1 tablet by mouth daily.   Yes [provider]  acetaminophen-codeine (TYLENOL #3) 300-30 MG per tablet Take 1 tablet by mouth every 6 (six) hours as needed for moderate pain. Patient not taking: Reported on 07/29/2014 03/16/14   Street, [place], PA-C  amoxicillin-clavulanate (AUGMENTIN) 875-125 MG per tablet Take 1 tablet by mouth 2 (two) times daily. Patient not taking:  Reported on 07/29/2014 04/17/14   Elson Areas, PA-C  azithromycin (ZITHROMAX) 250 MG tablet Take 1 tablet (250 mg total) by mouth daily. Take first 2 tablets together, then 1 every day until finished. Patient not taking: Reported on 02/03/2015 11/25/14   Elpidio Anis, PA-C  dicyclomine (BENTYL) 20 MG tablet Take 1 tablet (20 mg total) by mouth 2 (two) times daily. Patient not taking: Reported on 02/03/2015 07/29/14   Arthor Captain, PA-C  esomeprazole (NEXIUM) 20 MG capsule Take 1 capsule (20 mg total) by mouth daily at 12 noon. Patient not taking: Reported on 02/03/2015 07/29/14   Arthor Captain, PA-C  HYDROcodone-acetaminophen (NORCO/VICODIN) 5-325 MG tablet Take 1 tablet by mouth every 4 (four) hours as needed. Patient not taking: Reported on 03/23/2017 01/27/16   Barrett Henle, PA-C  ibuprofen (ADVIL,MOTRIN) 600 MG tablet Take 1 tablet (600 mg total) by mouth every 6 (six) hours as needed. Patient not taking: Reported on 03/23/2017 01/27/16   Barrett Henle, PA-C  naproxen (NAPROSYN) 500 MG tablet Take 1 tablet (500 mg total) by mouth 2 (two) times daily as needed for mild pain, moderate pain or headache (TAKE WITH MEALS.). Patient not taking: Reported on 07/29/2014 03/16/14   Street, [place], PA-C  ondansetron (ZOFRAN) 4 MG tablet Take 1 tablet (4 mg total) by mouth every 6 (six) hours. 03/23/17   Subrina Vecchiarelli, Tinnie Gens, PA-C  oseltamivir (TAMIFLU) 75 MG capsule Take 1 capsule (75 mg total) by mouth every 12 (twelve) hours. Patient not taking: Reported on  03/23/2017 07/19/16   Kirichenko, [place] Fillers, PA-C  promethazine (PHENERGAN) 25 MG tablet Take 1 tablet (25 mg total) by mouth every 6 (six) hours as needed for nausea or vomiting. Patient not taking: Reported on 03/23/2017 10/24/15   Lavera Guise, MD  sucralfate (CARAFATE) 1 G tablet Take 1 tablet (1 g total) by mouth 4 (four) times daily -  with meals and at bedtime. Patient not taking: Reported on 02/03/2015 07/29/14   Arthor Captain,  PA-C    Family History No family history on file.  Social History Social History  Substance Use Topics  . Smoking status: Never Smoker  . Smokeless tobacco: Never Used  . Alcohol use Yes     Comment: rarely     Allergies   Peanut-containing drug products   Review of Systems Review of Systems  All other systems reviewed and are negative.    Physical Exam Updated Vital Signs BP 102/78 (BP Location: Right Arm)   Pulse 74   Temp 98.4 F (36.9 C) (Oral)   Resp 16   LMP 03/23/2017   SpO2 100%   Physical Exam  Constitutional: She is oriented to person, place, and time. She appears well-developed and well-nourished.  HENT:  Head: Normocephalic and atraumatic.  Eyes: Pupils are equal, round, and reactive to light. Conjunctivae are normal. Right eye exhibits no discharge. Left eye exhibits no discharge. No scleral icterus.  Neck: Normal range of motion. No JVD present. No tracheal deviation present.  Pulmonary/Chest: Effort normal. No stridor.  Abdominal: Soft. She exhibits no distension and no mass. There is no tenderness. There is no rebound and no guarding. No hernia.  Neurological: She is alert and oriented to person, place, and time. Coordination normal.  Psychiatric: She has a normal mood and affect. Her behavior is normal. Judgment and thought content normal.  Nursing note and vitals reviewed.   ED Treatments / Results  Labs (all labs ordered are listed, but only abnormal results are displayed) Labs Reviewed  COMPREHENSIVE METABOLIC PANEL - Abnormal; Notable for the following:       Result Value   Chloride 100 (*)    All other components within normal limits  LIPASE, BLOOD  CBC  URINALYSIS, ROUTINE W REFLEX MICROSCOPIC  I-STAT BETA HCG BLOOD, ED ([facility], [facility], [facility] ONLY)    EKG  EKG Interpretation None       Radiology No results found.  Procedures Procedures (including critical care time)  Medications Ordered in ED Medications - No data to  display   Initial Impression / Assessment and Plan / ED Course  I have reviewed the triage vital signs and the nursing notes.  Pertinent labs & imaging results that were available during my care of the patient were reviewed by me and considered in my medical decision making (see chart for details).       Final Clinical Impressions(s) / ED Diagnoses   Final diagnoses:  Abdominal cramping    Labs: I-STAT beta-hCG, lipase, CMP, CBC  Imaging:  Consults:  Therapeutics:  Discharge Meds:   Assessment/Plan: 26 year old female presents today with numerous complaints.  These do not appear to be acute, no signs of infectious etiology, very low suspicion for any life-threatening etiology.  These are likely related to changes in birth control.  Patient will be instructed to follow-up as an outpatient with a primary care return immediately for any new or worsening signs or symptoms.  She verbalized understanding and agreement to today's plan had no further questions or concerns  at time discharge   New Prescriptions Discharge Medication List as of 03/23/2017 10:20 PM       Eyvonne Mechanic, PA-C 03/23/17 2250    Mesner, Barbara Cower, MD 03/23/17 2257

## 2017-08-16 ENCOUNTER — Emergency Department (HOSPITAL_COMMUNITY)
Admission: EM | Admit: 2017-08-16 | Discharge: 2017-08-16 | Disposition: A | Discharge status: Home / Self Care | Payer: BLUE CROSS/BLUE SHIELD | Attending: Emergency Medicine | Admitting: Emergency Medicine

## 2017-08-16 ENCOUNTER — Emergency Department (HOSPITAL_COMMUNITY): Payer: BLUE CROSS/BLUE SHIELD

## 2017-08-16 ENCOUNTER — Encounter (HOSPITAL_COMMUNITY): Payer: Self-pay | Admitting: *Deleted

## 2017-08-16 DIAGNOSIS — R05 Cough: Secondary | ICD-10-CM | POA: Insufficient documentation

## 2017-08-16 DIAGNOSIS — R0789 Other chest pain: Secondary | ICD-10-CM

## 2017-08-16 DIAGNOSIS — Z79899 Other long term (current) drug therapy: Secondary | ICD-10-CM | POA: Insufficient documentation

## 2017-08-16 DIAGNOSIS — R079 Chest pain, unspecified: Secondary | ICD-10-CM | POA: Diagnosis not present

## 2017-08-16 LAB — CBC
HEMATOCRIT: 40.2 % (ref 36.0–46.0)
Hemoglobin: 13.3 g/dL (ref 12.0–15.0)
MCH: 28.1 pg (ref 26.0–34.0)
MCHC: 33.1 g/dL (ref 30.0–36.0)
MCV: 85 fL (ref 78.0–100.0)
Platelets: 263 10*3/uL (ref 150–400)
RBC: 4.73 MIL/uL (ref 3.87–5.11)
RDW: 12.5 % (ref 11.5–15.5)
WBC: 5.2 10*3/uL (ref 4.0–10.5)

## 2017-08-16 LAB — BASIC METABOLIC PANEL
Anion gap: 8 (ref 5–15)
BUN: 10 mg/dL (ref 6–20)
CHLORIDE: 102 mmol/L (ref 101–111)
CO2: 29 mmol/L (ref 22–32)
Calcium: 10 mg/dL (ref 8.9–10.3)
Creatinine, Ser: 0.77 mg/dL (ref 0.44–1.00)
GFR calc Af Amer: >60 mL/min (ref >60)
GLUCOSE: 85 mg/dL (ref 65–99)
POTASSIUM: 4 mmol/L (ref 3.5–5.1)
Sodium: 139 mmol/L (ref 135–145)

## 2017-08-16 LAB — I-STAT BETA HCG BLOOD, ED (MC, WL, AP ONLY): I-stat hCG, quantitative: <5 m[IU]/mL (ref <5)

## 2017-08-16 LAB — I-STAT TROPONIN, ED: Troponin i, poc: 0 ng/mL (ref 0.00–0.08)

## 2017-08-16 MED ORDER — GI COCKTAIL ~~LOC~~
30.0000 mL | Freq: Once | ORAL | Status: AC
  Administered 2017-08-16: 30 mL via ORAL
  Filled 2017-08-16: qty 30

## 2017-08-16 NOTE — ED Triage Notes (Signed)
Pt complains of burning chest pain. Pt has tried medication for indigestion w/o relief.

## 2017-08-16 NOTE — ED Provider Notes (Signed)
[facility]-EMERGENCY DEPT Provider Note   CSN: [id number] Arrival date & time: 08/16/17  1502     History   Chief Complaint Chief Complaint  Patient presents with  . Chest Pain    HPI Julie Mason is a 27 y.o. female.  27 yo F with a cc of chest pain.  Going on for past five days.  Feels like her prior reflux but worse.  Describes it as a burning pain that radiates up to the base of her neck.  Causes her to cough.  Everything she eats makes it worse.  Tried brat diet without improvement.  Not exertional, no sob.  No lower extremity edema.  No hx of PE or dvt.  No recent surgeries or prolonged travel.  Not on estrogen.  Denies history of smoking denies hyperlipidemia hypertension diabetes.  No family history of MI.   The history is provided by the patient.  Chest Pain   This is a new problem. The current episode started more than 2 days ago. The problem occurs constantly. The problem has been gradually worsening. The pain is at a severity of 4/10. The pain is mild. The quality of the pain is described as brief. The pain does not radiate. Duration of episode(s) is 2 days. Pertinent negatives include no dizziness, no fever, no headaches, no nausea, no palpitations, no shortness of breath and no vomiting. She has tried nothing for the symptoms. The treatment provided no relief. There are no known risk factors.  Pertinent negatives for past medical history include no DVT, no MI and no PE.    History reviewed. No pertinent past medical history.  There are no active problems to display for this patient.   History reviewed. No pertinent surgical history.  OB History    No data available       Home Medications    Prior to Admission medications   Medication Sig Start Date End Date Taking? Authorizing Provider  acetaminophen-codeine (TYLENOL #3) 300-30 MG per tablet Take 1 tablet by mouth every 6 (six) hours as needed for moderate pain. Patient not taking:  Reported on 07/29/2014 03/16/14   Street, [place], PA-C  amoxicillin-clavulanate (AUGMENTIN) 875-125 MG per tablet Take 1 tablet by mouth 2 (two) times daily. Patient not taking: Reported on 07/29/2014 04/17/14   Elson Areas, PA-C  azithromycin (ZITHROMAX) 250 MG tablet Take 1 tablet (250 mg total) by mouth daily. Take first 2 tablets together, then 1 every day until finished. Patient not taking: Reported on 02/03/2015 11/25/14   Elpidio Anis, PA-C  BIOTIN PO Take 1 tablet by mouth daily.    [provider]  dicyclomine (BENTYL) 20 MG tablet Take 1 tablet (20 mg total) by mouth 2 (two) times daily. Patient not taking: Reported on 02/03/2015 07/29/14   Arthor Captain, PA-C  esomeprazole (NEXIUM) 20 MG capsule Take 1 capsule (20 mg total) by mouth daily at 12 noon. Patient not taking: Reported on 02/03/2015 07/29/14   Arthor Captain, PA-C  HYDROcodone-acetaminophen (NORCO/VICODIN) 5-325 MG tablet Take 1 tablet by mouth every 4 (four) hours as needed. Patient not taking: Reported on 03/23/2017 01/27/16   Barrett Henle, PA-C  ibuprofen (ADVIL,MOTRIN) 600 MG tablet Take 1 tablet (600 mg total) by mouth every 6 (six) hours as needed. Patient not taking: Reported on 03/23/2017 01/27/16   Barrett Henle, PA-C  Multiple Vitamins-Minerals (MULTIVITAMIN ADULT) TABS Take 1 tablet by mouth daily.    [provider]  naproxen (NAPROSYN) 500 MG  tablet Take 1 tablet (500 mg total) by mouth 2 (two) times daily as needed for mild pain, moderate pain or headache (TAKE WITH MEALS.). Patient not taking: Reported on 07/29/2014 03/16/14   Street, [place], PA-C  ondansetron (ZOFRAN) 4 MG tablet Take 1 tablet (4 mg total) by mouth every 6 (six) hours. 03/23/17   Hedges, Tinnie GensJeffrey, PA-C  oseltamivir (TAMIFLU) 75 MG capsule Take 1 capsule (75 mg total) by mouth every 12 (twelve) hours. Patient not taking: Reported on 03/23/2017 07/19/16   Jaynie CrumbleKirichenko, Tatyana, PA-C  promethazine (PHENERGAN) 25 MG  tablet Take 1 tablet (25 mg total) by mouth every 6 (six) hours as needed for nausea or vomiting. Patient not taking: Reported on 03/23/2017 10/24/15   Lavera GuiseLiu, Dana Duo, MD  sucralfate (CARAFATE) 1 G tablet Take 1 tablet (1 g total) by mouth 4 (four) times daily -  with meals and at bedtime. Patient not taking: Reported on 02/03/2015 07/29/14   Arthor CaptainHarris, Abigail, PA-C    Family History No family history on file.  Social History Social History   Tobacco Use  . Smoking status: Never Smoker  . Smokeless tobacco: Never Used  Substance Use Topics  . Alcohol use: Yes    Comment: rarely  . Drug use: No     Allergies   Peanut-containing drug products   Review of Systems Review of Systems  Constitutional: Negative for chills and fever.  HENT: Negative for congestion and rhinorrhea.   Eyes: Negative for redness and visual disturbance.  Respiratory: Negative for shortness of breath and wheezing.   Cardiovascular: Positive for chest pain. Negative for palpitations.  Gastrointestinal: Negative for nausea and vomiting.  Genitourinary: Negative for dysuria and urgency.  Musculoskeletal: Negative for arthralgias and myalgias.  Skin: Negative for pallor and wound.  Neurological: Negative for dizziness and headaches.     Physical Exam Updated Vital Signs BP (!) 118/92 (BP Location: Left Arm)   Pulse 64   Temp 98.6 F (37 C) (Oral)   Resp 13   LMP 07/31/2017   SpO2 100%   Physical Exam  Constitutional: She is oriented to person, place, and time. She appears well-developed and well-nourished. No distress.  HENT:  Head: Normocephalic and atraumatic.  Eyes: EOM are normal. Pupils are equal, round, and reactive to light.  Neck: Normal range of motion. Neck supple.  Cardiovascular: Normal rate and regular rhythm. Exam reveals no gallop and no friction rub.  No murmur heard. Pulmonary/Chest: Effort normal. She has no wheezes. She has no rales.    Abdominal: Soft. She exhibits no  distension. There is no tenderness.  Musculoskeletal: She exhibits no edema or tenderness.  Neurological: She is alert and oriented to person, place, and time.  Skin: Skin is warm and dry. She is not diaphoretic.  Psychiatric: She has a normal mood and affect. Her behavior is normal.  Nursing note and vitals reviewed.    ED Treatments / Results  Labs (all labs ordered are listed, but only abnormal results are displayed) Labs Reviewed  BASIC METABOLIC PANEL  CBC  I-STAT TROPONIN, ED  I-STAT BETA HCG BLOOD, ED ([facility], [facility], [facility] ONLY)    EKG  EKG Interpretation  Date/Time:  Thursday August 16 2017 15:48:46 EST Ventricular Rate:  66 PR Interval:    QRS Duration: 92 QT Interval:  397 QTC Calculation: 416 R Axis:   79 Text Interpretation:  Sinus rhythm No old tracing to compare Confirmed by Mancel BaleWentz, Elliott [id number]) on 08/16/2017 4:29:12 PM  Radiology Dg Chest 2 View  Result Date: 08/16/2017 CLINICAL DATA:  Burning chest pain for the past week. EXAM: CHEST  2 VIEW COMPARISON:  None. FINDINGS: The heart size and mediastinal contours are within normal limits. Both lungs are clear. The visualized skeletal structures are unremarkable. IMPRESSION: Normal chest x-ray. Electronically Signed   By: Obie Dredge M.D.   On: 08/16/2017 17:13    Procedures Procedures (including critical care time)  Medications Ordered in ED Medications  gi cocktail (Maalox,Lidocaine,Donnatal) (30 mLs Oral Given 08/16/17 2146)     Initial Impression / Assessment and Plan / ED Course  I have reviewed the triage vital signs and the nursing notes.  Pertinent labs & imaging results that were available during my care of the patient were reviewed by me and considered in my medical decision making (see chart for details).     27 yo F with a chief complaint of epigastric burning that radiates up to the back of her neck.  Seems related to oral intake.  Most likely reflux in nature.  Labs ordered in triage  including a troponin are unremarkable.  She is not pregnant.  An EKG is unremarkable.  Chest x-ray reviewed by me and unremarkable as well.  Discussed therapies with the patient.  We will have her start Zantac or Pepcid.  Have her follow-up with her family physician.  11:15 PM:  I have discussed the diagnosis/risks/treatment options with the patient and family and believe the pt to be eligible for discharge home to follow-up with PCP. We also discussed returning to the ED immediately if new or worsening sx occur. We discussed the sx which are most concerning (e.g., sudden worsening pain, fever, inability to tolerate by mouth) that necessitate immediate return. Medications administered to the patient during their visit and any new prescriptions provided to the patient are listed below.  Medications given during this visit Medications  gi cocktail (Maalox,Lidocaine,Donnatal) (30 mLs Oral Given 08/16/17 2146)     The patient appears reasonably screen and/or stabilized for discharge and I doubt any other medical condition or other [facility] requiring further screening, evaluation, or treatment in the ED at this time prior to discharge.    Final Clinical Impressions(s) / ED Diagnoses   Final diagnoses:  Atypical chest pain    ED Discharge Orders    None       Melene Plan, DO 08/16/17 2315

## 2017-09-11 ENCOUNTER — Ambulatory Visit: Payer: BLUE CROSS/BLUE SHIELD | Admitting: Nurse Practitioner

## 2017-09-11 ENCOUNTER — Encounter: Payer: Self-pay | Admitting: Nurse Practitioner

## 2017-09-11 ENCOUNTER — Other Ambulatory Visit (HOSPITAL_COMMUNITY)
Admission: RE | Admit: 2017-09-11 | Discharge: 2017-09-11 | Disposition: A | Discharge status: Home / Self Care | Payer: BLUE CROSS/BLUE SHIELD | Source: Ambulatory Visit | Attending: Nurse Practitioner | Admitting: Nurse Practitioner

## 2017-09-11 VITALS — BP 100/70 | HR 64 | Temp 98.7°F | Ht 65.0 in | Wt 172.2 lb

## 2017-09-11 DIAGNOSIS — N898 Other specified noninflammatory disorders of vagina: Secondary | ICD-10-CM | POA: Diagnosis not present

## 2017-09-11 DIAGNOSIS — N912 Amenorrhea, unspecified: Secondary | ICD-10-CM | POA: Insufficient documentation

## 2017-09-11 NOTE — Progress Notes (Addendum)
Subjective:  Patient ID: Julie Mason, female    DOB: 07/05/1990  Age: 27 y.o. MRN: [id number]  CC: Possible Pregnancy (would like to see if she is pregnant?)   Possible Pregnancy  This is a new problem. The current episode started 1 to 4 weeks ago. The problem has been unchanged. Associated symptoms include abdominal pain. Pertinent negatives include no anorexia, chest pain, chills, congestion, coughing, fatigue, fever, headaches, myalgias, nausea, numbness, rash, sore throat, swollen glands, urinary symptoms or weakness.  ABD cramps, breast tenderness, vaginal discharge(no odor,white to clear discharge)  LMP 07/30/17, typically regular, every 28days. Negative home pregnancy. serun HCG done 08/16/2017: negative Sexually active with husband. No condom or contraceptive use.  She want repeat serum pregnancy.  Outpatient Medications Prior to Visit  Medication Sig Dispense Refill  . Multiple Vitamins-Minerals (MULTIVITAMIN ADULT) TABS Take 1 tablet by mouth daily.    Marland Kitchen BIOTIN PO Take 1 tablet by mouth daily.    Marland Kitchen acetaminophen-codeine (TYLENOL #3) 300-30 MG per tablet Take 1 tablet by mouth every 6 (six) hours as needed for moderate pain. (Patient not taking: Reported on 07/29/2014) 20 tablet 0  . amoxicillin-clavulanate (AUGMENTIN) 875-125 MG per tablet Take 1 tablet by mouth 2 (two) times daily. (Patient not taking: Reported on 07/29/2014) 20 tablet 0  . azithromycin (ZITHROMAX) 250 MG tablet Take 1 tablet (250 mg total) by mouth daily. Take first 2 tablets together, then 1 every day until finished. (Patient not taking: Reported on 02/03/2015) 6 tablet 0  . dicyclomine (BENTYL) 20 MG tablet Take 1 tablet (20 mg total) by mouth 2 (two) times daily. (Patient not taking: Reported on 02/03/2015) 20 tablet 0  . esomeprazole (NEXIUM) 20 MG capsule Take 1 capsule (20 mg total) by mouth daily at 12 noon. (Patient not taking: Reported on 02/03/2015) 30 capsule 0  . HYDROcodone-acetaminophen  (NORCO/VICODIN) 5-325 MG tablet Take 1 tablet by mouth every 4 (four) hours as needed. (Patient not taking: Reported on 03/23/2017) 6 tablet 0  . ibuprofen (ADVIL,MOTRIN) 600 MG tablet Take 1 tablet (600 mg total) by mouth every 6 (six) hours as needed. (Patient not taking: Reported on 03/23/2017) 30 tablet 0  . naproxen (NAPROSYN) 500 MG tablet Take 1 tablet (500 mg total) by mouth 2 (two) times daily as needed for mild pain, moderate pain or headache (TAKE WITH MEALS.). (Patient not taking: Reported on 07/29/2014) 20 tablet 0  . ondansetron (ZOFRAN) 4 MG tablet Take 1 tablet (4 mg total) by mouth every 6 (six) hours. (Patient not taking: Reported on 09/11/2017) 12 tablet 0  . oseltamivir (TAMIFLU) 75 MG capsule Take 1 capsule (75 mg total) by mouth every 12 (twelve) hours. (Patient not taking: Reported on 03/23/2017) 10 capsule 0  . promethazine (PHENERGAN) 25 MG tablet Take 1 tablet (25 mg total) by mouth every 6 (six) hours as needed for nausea or vomiting. (Patient not taking: Reported on 03/23/2017) 20 tablet 0  . sucralfate (CARAFATE) 1 G tablet Take 1 tablet (1 g total) by mouth 4 (four) times daily -  with meals and at bedtime. (Patient not taking: Reported on 02/03/2015) 90 tablet 0   No facility-administered medications prior to visit.    Social History   Socioeconomic History  . Marital status: Single    Spouse name: Not on file  . Number of children: Not on file  . Years of education: Not on file  . Highest education level: Not on file  Occupational History  . Not on file  Social Needs  . Financial resource strain: Not on file  . Food insecurity:    Worry: Not on file    Inability: Not on file  . Transportation needs:    Medical: Not on file    Non-medical: Not on file  Tobacco Use  . Smoking status: Never Smoker  . Smokeless tobacco: Never Used  Substance and Sexual Activity  . Alcohol use: Yes    Comment: rarely  . Drug use: No  . Sexual activity: Yes    Birth  control/protection: None  Lifestyle  . Physical activity:    Days per week: Not on file    Minutes per session: Not on file  . Stress: Not on file  Relationships  . Social connections:    Talks on phone: Not on file    Gets together: Not on file    Attends religious service: Not on file    Active member of club or organization: Not on file    Attends meetings of clubs or organizations: Not on file    Relationship status: Not on file  . Intimate partner violence:    Fear of current or ex partner: Not on file    Emotionally abused: Not on file    Physically abused: Not on file    Forced sexual activity: Not on file  Other Topics Concern  . Not on file  Social History Narrative  . Not on file   ROS Review of Systems  Constitutional: Negative for chills, fatigue, fever, malaise/fatigue and weight loss.  HENT: Negative for congestion and sore throat.   Eyes:       Negative for visual changes  Respiratory: Negative for cough and shortness of breath.   Cardiovascular: Negative for chest pain, palpitations and leg swelling.  Gastrointestinal: Positive for abdominal pain. Negative for anorexia, blood in stool, constipation, diarrhea, heartburn and nausea.  Genitourinary: Negative for dysuria, frequency and urgency.  Musculoskeletal: Negative for falls, joint pain and myalgias.  Skin: Negative for rash.  Neurological: Negative for dizziness, sensory change, weakness, numbness and headaches.  Endo/Heme/Allergies: Does not bruise/bleed easily.  Psychiatric/Behavioral: Negative for depression, substance abuse and suicidal ideas. The patient is nervous/anxious. The patient does not have insomnia.      Objective:  BP 100/70 (BP Location: Right Arm, Patient Position: Sitting, Cuff Size: Normal)   Pulse 64   Temp 98.7 F (37.1 C) (Oral)   Ht 5\' 5"  (1.651 m)   Wt 172 lb 3.2 oz (78.1 kg)   LMP 07/30/2017   SpO2 99%   BMI 28.66 kg/m   BP Readings from Last 3 Encounters:  09/11/17  100/70  08/16/17 (!) 118/92  03/23/17 102/78    Wt Readings from Last 3 Encounters:  09/11/17 172 lb 3.2 oz (78.1 kg)  07/19/16 170 lb (77.1 kg)  11/25/14 160 lb (72.6 kg)    Physical Exam  Constitutional: She is oriented to person, place, and time. No distress.  Neck: No thyromegaly present.  Cardiovascular: Normal rate, regular rhythm and normal heart sounds.  Pulmonary/Chest: Effort normal and breath sounds normal.  Abdominal: She exhibits no distension.  Neurological: She is alert and oriented to person, place, and time.  Skin: Skin is warm and dry.  Psychiatric: She has a normal mood and affect. Her behavior is normal.  Vitals reviewed.   Lab Results  Component Value Date   WBC 6.2 09/11/2017   HGB 13.5 09/11/2017   HCT 41.3 09/11/2017   PLT 257.0 09/11/2017  GLUCOSE 83 09/11/2017   ALT 14 09/11/2017   AST 16 09/11/2017   NA 138 09/11/2017   K 4.1 09/11/2017   CL 103 09/11/2017   CREATININE 0.76 09/11/2017   BUN 12 09/11/2017   CO2 27 09/11/2017   TSH 2.16 09/11/2017    Dg Chest 2 View  Result Date: 08/16/2017 CLINICAL DATA:  Burning chest pain for the past week. EXAM: CHEST  2 VIEW COMPARISON:  None. FINDINGS: The heart size and mediastinal contours are within normal limits. Both lungs are clear. The visualized skeletal structures are unremarkable. IMPRESSION: Normal chest x-ray. Electronically Signed   By: Obie DredgeWilliam T Derry M.D.   On: 08/16/2017 17:13    Assessment & Plan:   Lupe CarneyDaisha was seen today for possible pregnancy.  Diagnoses and all orders for this visit:  Amenorrhea -     B-HCG Quant -     CBC -     Comprehensive metabolic panel -     TSH  Vaginal discharge -     Urine cytology ancillary only -     metroNIDAZOLE (METROGEL VAGINAL) 0.75 % vaginal gel; Place 1 Applicatorful vaginally at bedtime.  Other orders -     Urine cytology ancillary only   I have discontinued Martesha Seifer's acetaminophen-codeine, naproxen, amoxicillin-clavulanate,  dicyclomine, sucralfate, esomeprazole, azithromycin, promethazine, ibuprofen, HYDROcodone-acetaminophen, oseltamivir, and ondansetron. I am also having her start on metroNIDAZOLE. Additionally, I am having her maintain her BIOTIN PO and MULTIVITAMIN ADULT.  Meds ordered this encounter  Medications  . metroNIDAZOLE (METROGEL VAGINAL) 0.75 % vaginal gel    Sig: Place 1 Applicatorful vaginally at bedtime.    Dispense:  70 g    Refill:  0    Order Specific Question:   Supervising Provider    Answer:   Dianne DunARON, TALIA M [[id number]]    Follow-up: Return if symptoms worsen or fail to improve.  Alysia Pennaharlotte Edmund Holcomb, NP

## 2017-09-11 NOTE — Patient Instructions (Addendum)
Negative pregnancy and normal labs results. Pending urine cytology.  Primary Amenorrhea Primary amenorrhea is the absence of any menstrual flow in a female by the age of 15 years. An average age for the start of menstruation is the age of 12 years. Primary amenorrhea is not considered to have occurred until a female is older than 15 years and has never menstruated. This may occur with or without other signs of puberty. What are the causes? Some common causes of not menstruating include:  Chromosomal abnormality causing the ovaries to malfunction is the most common cause of primary amenorrhea.  Malnutrition.  Low blood sugar (hypoglycemia).  Polycystic ovary syndrome (cysts in the ovaries, not ovulating).  Absence of the vagina, uterus, or ovaries since birth (congenital).  Extreme obesity.  Cystic fibrosis.  Drastic weight loss from any cause.  Over-exercising (running, biking) causing loss of body fat.  Pituitary gland tumor in the brain.  Long-term (chronic) illnesses.  Cushing disease.  Thyroid disease (hypothyroidism, hyperthyroidism).  Part of the brain (hypothalamus) not functioning normally.  Premature ovarian failure.  What are the signs or symptoms? No menstruation by age 26 years in normally developed females is the primary symptom. Other symptoms may include:  Discharge from the breasts.  Hot flashes.  Adult acne.  Facial or chest hair.  Headaches.  Impaired vision.  Recent stress.  Changes in weight, diet, or exercise patterns.  How is this diagnosed? Primary amenorrhea is diagnosed with the help of a medical history and a physical exam. Other tests that may be recommended include:  Blood tests to check for pregnancy, hormonal changes, a bleeding or thyroid disorder, low iron levels (anemia), or other problems.  Urine tests.  Specialized X-ray exams.  How is this treated? Treatment will depend on the cause. For example, some of the causes  of primary amenorrhea, such as congenital absence of sex organs, will require surgery to correct. Others may respond to treatment with medicine. Contact a health care provider if:  There has not been any menstrual flow by age 26 years.  Body maturation does not occur at a level typical of peers.  Pelvic area pain occurs.  There is unusual weight gain or hair growth. This information is not intended to replace advice given to you by your health care provider. Make sure you discuss any questions you have with your health care provider. Document Released: 06/05/2005 Document Revised: 11/11/2015 Document Reviewed: 01/15/2013 Elsevier Interactive Patient Education  2018 [employer]

## 2017-09-12 ENCOUNTER — Encounter: Payer: Self-pay | Admitting: Nurse Practitioner

## 2017-09-12 LAB — COMPREHENSIVE METABOLIC PANEL
ALT: 14 U/L (ref 0–35)
AST: 16 U/L (ref 0–37)
Albumin: 4.1 g/dL (ref 3.5–5.2)
Alkaline Phosphatase: 55 U/L (ref 39–117)
BUN: 12 mg/dL (ref 6–23)
CALCIUM: 9.4 mg/dL (ref 8.4–10.5)
CHLORIDE: 103 meq/L (ref 96–112)
CO2: 27 meq/L (ref 19–32)
CREATININE: 0.76 mg/dL (ref 0.40–1.20)
GFR: 118.21 mL/min (ref >60.00)
GLUCOSE: 83 mg/dL (ref 70–99)
Potassium: 4.1 mEq/L (ref 3.5–5.1)
SODIUM: 138 meq/L (ref 135–145)
Total Bilirubin: 0.4 mg/dL (ref 0.2–1.2)
Total Protein: 7.6 g/dL (ref 6.0–8.3)

## 2017-09-12 LAB — CBC
HCT: 41.3 % (ref 36.0–46.0)
Hemoglobin: 13.5 g/dL (ref 12.0–15.0)
MCHC: 32.7 g/dL (ref 30.0–36.0)
MCV: 85.9 fl (ref 78.0–100.0)
Platelets: 257 10*3/uL (ref 150.0–400.0)
RBC: 4.81 Mil/uL (ref 3.87–5.11)
RDW: 13 % (ref 11.5–15.5)
WBC: 6.2 10*3/uL (ref 4.0–10.5)

## 2017-09-12 LAB — URINE CYTOLOGY ANCILLARY ONLY
CHLAMYDIA, DNA PROBE: NEGATIVE
Neisseria Gonorrhea: NEGATIVE
Trichomonas: NEGATIVE

## 2017-09-12 LAB — TSH: TSH: 2.16 u[IU]/mL (ref 0.35–4.50)

## 2017-09-12 LAB — HCG, QUANTITATIVE, PREGNANCY: QUANTITATIVE HCG: 0.06 m[IU]/mL

## 2017-09-14 LAB — URINE CYTOLOGY ANCILLARY ONLY: Candida vaginitis: NEGATIVE

## 2017-09-17 MED ORDER — METRONIDAZOLE 0.75 % VA GEL: 1.0000 | Freq: Every day | VAGINAL | 0 refills | Status: DC

## 2017-09-17 NOTE — Addendum Note (Signed)
Addended by: Alysia PennaNCHE, Asa Fath L on: 09/17/2017 12:25 PM   Modules accepted: Orders

## 2017-09-18 ENCOUNTER — Encounter: Payer: BLUE CROSS/BLUE SHIELD | Admitting: Nurse Practitioner

## 2017-10-02 ENCOUNTER — Encounter: Payer: Self-pay | Admitting: Nurse Practitioner

## 2017-10-02 ENCOUNTER — Other Ambulatory Visit (HOSPITAL_COMMUNITY)
Admission: RE | Admit: 2017-10-02 | Discharge: 2017-10-02 | Disposition: A | Discharge status: Home / Self Care | Payer: BLUE CROSS/BLUE SHIELD | Source: Ambulatory Visit | Attending: Nurse Practitioner | Admitting: Nurse Practitioner

## 2017-10-02 ENCOUNTER — Ambulatory Visit (INDEPENDENT_AMBULATORY_CARE_PROVIDER_SITE_OTHER): Payer: BLUE CROSS/BLUE SHIELD | Admitting: Nurse Practitioner

## 2017-10-02 VITALS — BP 118/80 | HR 66 | Temp 98.6°F | Ht 65.0 in | Wt 177.2 lb

## 2017-10-02 DIAGNOSIS — Z23 Encounter for immunization: Secondary | ICD-10-CM

## 2017-10-02 DIAGNOSIS — Z124 Encounter for screening for malignant neoplasm of cervix: Secondary | ICD-10-CM

## 2017-10-02 DIAGNOSIS — B9689 Other specified bacterial agents as the cause of diseases classified elsewhere: Secondary | ICD-10-CM | POA: Insufficient documentation

## 2017-10-02 DIAGNOSIS — B373 Candidiasis of vulva and vagina: Secondary | ICD-10-CM | POA: Insufficient documentation

## 2017-10-02 DIAGNOSIS — N760 Acute vaginitis: Secondary | ICD-10-CM | POA: Insufficient documentation

## 2017-10-02 DIAGNOSIS — Z0000 Encounter for general adult medical examination without abnormal findings: Secondary | ICD-10-CM | POA: Diagnosis not present

## 2017-10-02 NOTE — Patient Instructions (Signed)
You will be called with lab results.  Use omeprazole 1tab once a day x 14days then stop. Let me know if nausea does not improvement in 2weeks.  Health Maintenance, Female Adopting a healthy lifestyle and getting preventive care can go a long way to promote health and wellness. Talk with your health care provider about what schedule of regular examinations is right for you. This is a good chance for you to check in with your provider about disease prevention and staying healthy. In between checkups, there are plenty of things you can do on your own. Experts have done a lot of research about which lifestyle changes and preventive measures are most likely to keep you healthy. Ask your health care provider for more information. Weight and diet Eat a healthy diet  Be sure to include plenty of vegetables, fruits, low-fat dairy products, and lean protein.  Do not eat a lot of foods high in solid fats, added sugars, or salt.  Get regular exercise. This is one of the most important things you can do for your health. ? Most adults should exercise for at least 150 minutes each week. The exercise should increase your heart rate and make you sweat (moderate-intensity exercise). ? Most adults should also do strengthening exercises at least twice a week. This is in addition to the moderate-intensity exercise.  Maintain a healthy weight  Body mass index (BMI) is a measurement that can be used to identify possible weight problems. It estimates body fat based on height and weight. Your health care provider can help determine your BMI and help you achieve or maintain a healthy weight.  For females 23 years of age and older: ? A BMI below 18.5 is considered underweight. ? A BMI of 18.5 to 24.9 is normal. ? A BMI of 25 to 29.9 is considered overweight. ? A BMI of 30 and above is considered obese.  Watch levels of cholesterol and blood lipids  You should start having your blood tested for lipids and  cholesterol at 26 years of age, then have this test every 5 years.  You may need to have your cholesterol levels checked more often if: ? Your lipid or cholesterol levels are high. ? You are older than 26 years of age. ? You are at high risk for heart disease.  Cancer screening Lung Cancer  Lung cancer screening is recommended for adults 60-53 years old who are at high risk for lung cancer because of a history of smoking.  A yearly low-dose CT scan of the lungs is recommended for people who: ? Currently smoke. ? Have quit within the past 15 years. ? Have at least a 30-pack-year history of smoking. A pack year is smoking an average of one pack of cigarettes a day for 1 year.  Yearly screening should continue until it has been 15 years since you quit.  Yearly screening should stop if you develop a health problem that would prevent you from having lung cancer treatment.  Breast Cancer  Practice breast self-awareness. This means understanding how your breasts normally appear and feel.  It also means doing regular breast self-exams. Let your health care provider know about any changes, no matter how small.  If you are in your 20s or 30s, you should have a clinical breast exam (CBE) by a health care provider every 1-3 years as part of a regular health exam.  If you are 26 or older, have a CBE every year. Also consider having a breast X-ray (  mammogram) every year.  If you have a family history of breast cancer, talk to your health care provider about genetic screening.  If you are at high risk for breast cancer, talk to your health care provider about having an MRI and a mammogram every year.  Breast cancer gene (BRCA) assessment is recommended for women who have family members with BRCA-related cancers. BRCA-related cancers include: ? Breast. ? Ovarian. ? Tubal. ? Peritoneal cancers.  Results of the assessment will determine the need for genetic counseling and BRCA1 and BRCA2  testing.  Cervical Cancer Your health care provider may recommend that you be screened regularly for cancer of the pelvic organs (ovaries, uterus, and vagina). This screening involves a pelvic examination, including checking for microscopic changes to the surface of your cervix (Pap test). You may be encouraged to have this screening done every 3 years, beginning at age 63.  For women ages 32-65, health care providers may recommend pelvic exams and Pap testing every 3 years, or they may recommend the Pap and pelvic exam, combined with testing for human papilloma virus (HPV), every 5 years. Some types of HPV increase your risk of cervical cancer. Testing for HPV may also be done on women of any age with unclear Pap test results.  Other health care providers may not recommend any screening for nonpregnant women who are considered low risk for pelvic cancer and who do not have symptoms. Ask your health care provider if a screening pelvic exam is right for you.  If you have had past treatment for cervical cancer or a condition that could lead to cancer, you need Pap tests and screening for cancer for at least 20 years after your treatment. If Pap tests have been discontinued, your risk factors (such as having a new sexual partner) need to be reassessed to determine if screening should resume. Some women have medical problems that increase the chance of getting cervical cancer. In these cases, your health care provider may recommend more frequent screening and Pap tests.  Colorectal Cancer  This type of cancer can be detected and often prevented.  Routine colorectal cancer screening usually begins at 26 years of age and continues through 26 years of age.  Your health care provider may recommend screening at an earlier age if you have risk factors for colon cancer.  Your health care provider may also recommend using home test kits to check for hidden blood in the stool.  A small camera at the end of a  tube can be used to examine your colon directly (sigmoidoscopy or colonoscopy). This is done to check for the earliest forms of colorectal cancer.  Routine screening usually begins at age 80.  Direct examination of the colon should be repeated every 5-10 years through 26 years of age. However, you may need to be screened more often if early forms of precancerous polyps or small growths are found.  Skin Cancer  Check your skin from head to toe regularly.  Tell your health care provider about any new moles or changes in moles, especially if there is a change in a mole's shape or color.  Also tell your health care provider if you have a mole that is larger than the size of a pencil eraser.  Always use sunscreen. Apply sunscreen liberally and repeatedly throughout the day.  Protect yourself by wearing long sleeves, pants, a wide-brimmed hat, and sunglasses whenever you are outside.  Heart disease, diabetes, and high blood pressure  High blood pressure  causes heart disease and increases the risk of stroke. High blood pressure is more likely to develop in: ? People who have blood pressure in the high end of the normal range (130-139/85-89 mm Hg). ? People who are overweight or obese. ? People who are African American.  If you are 84-42 years of age, have your blood pressure checked every 3-5 years. If you are 50 years of age or older, have your blood pressure checked every year. You should have your blood pressure measured twice-once when you are at a hospital or clinic, and once when you are not at a hospital or clinic. Record the average of the two measurements. To check your blood pressure when you are not at a hospital or clinic, you can use: ? An automated blood pressure machine at a pharmacy. ? A home blood pressure monitor.  If you are between 7 years and 63 years old, ask your health care provider if you should take aspirin to prevent strokes.  Have regular diabetes screenings. This  involves taking a blood sample to check your fasting blood sugar level. ? If you are at a normal weight and have a low risk for diabetes, have this test once every three years after 26 years of age. ? If you are overweight and have a high risk for diabetes, consider being tested at a younger age or more often. Preventing infection Hepatitis B  If you have a higher risk for hepatitis B, you should be screened for this virus. You are considered at high risk for hepatitis B if: ? You were born in a country where hepatitis B is common. Ask your health care provider which countries are considered high risk. ? Your parents were born in a high-risk country, and you have not been immunized against hepatitis B (hepatitis B vaccine). ? You have HIV or AIDS. ? You use needles to inject street drugs. ? You live with someone who has hepatitis B. ? You have had sex with someone who has hepatitis B. ? You get hemodialysis treatment. ? You take certain medicines for conditions, including cancer, organ transplantation, and autoimmune conditions.  Hepatitis C  Blood testing is recommended for: ? Everyone born from [birth year] through 1964. ? Anyone with known risk factors for hepatitis C.  Sexually transmitted infections (STIs)  You should be screened for sexually transmitted infections (STIs) including gonorrhea and chlamydia if: ? You are sexually active and are younger than 26 years of age. ? You are older than 26 years of age and your health care provider tells you that you are at risk for this type of infection. ? Your sexual activity has changed since you were last screened and you are at an increased risk for chlamydia or gonorrhea. Ask your health care provider if you are at risk.  If you do not have HIV, but are at risk, it may be recommended that you take a prescription medicine daily to prevent HIV infection. This is called pre-exposure prophylaxis (PrEP). You are considered at risk if: ? You are  sexually active and do not regularly use condoms or know the HIV status of your partner(s). ? You take drugs by injection. ? You are sexually active with a partner who has HIV.  Talk with your health care provider about whether you are at high risk of being infected with HIV. If you choose to begin PrEP, you should first be tested for HIV. You should then be tested every 3 months for as long  as you are taking PrEP. Pregnancy  If you are premenopausal and you may become pregnant, ask your health care provider about preconception counseling.  If you may become pregnant, take 400 to 800 micrograms (mcg) of folic acid every day.  If you want to prevent pregnancy, talk to your health care provider about birth control (contraception). Osteoporosis and menopause  Osteoporosis is a disease in which the bones lose minerals and strength with aging. This can result in serious bone fractures. Your risk for osteoporosis can be identified using a bone density scan.  If you are 32 years of age or older, or if you are at risk for osteoporosis and fractures, ask your health care provider if you should be screened.  Ask your health care provider whether you should take a calcium or vitamin D supplement to lower your risk for osteoporosis.  Menopause may have certain physical symptoms and risks.  Hormone replacement therapy may reduce some of these symptoms and risks. Talk to your health care provider about whether hormone replacement therapy is right for you. Follow these instructions at home:  Schedule regular health, dental, and eye exams.  Stay current with your immunizations.  Do not use any tobacco products including cigarettes, chewing tobacco, or electronic cigarettes.  If you are pregnant, do not drink alcohol.  If you are breastfeeding, limit how much and how often you drink alcohol.  Limit alcohol intake to no more than 1 drink per day for nonpregnant women. One drink equals 12 ounces of  beer, 5 ounces of wine, or 1 ounces of hard liquor.  Do not use street drugs.  Do not share needles.  Ask your health care provider for help if you need support or information about quitting drugs.  Tell your health care provider if you often feel depressed.  Tell your health care provider if you have ever been abused or do not feel safe at home. This information is not intended to replace advice given to you by your health care provider. Make sure you discuss any questions you have with your health care provider. Document Released: 12/19/2010 Document Revised: 11/11/2015 Document Reviewed: 03/09/2015 Elsevier Interactive Patient Education  [employer].

## 2017-10-02 NOTE — Progress Notes (Signed)
Subjective:    Patient ID: Julie Mason, female    DOB: 04-21-1991, 27 y.o.   MRN: [id number]  Patient presents today for complete physical  GI Problem  The primary symptoms include nausea. Primary symptoms do not include fever, weight loss, fatigue, abdominal pain, vomiting, diarrhea, melena, hematemesis, jaundice, hematochezia, dysuria, myalgias, arthralgias or rash. The illness began more than 7 days ago. The onset was gradual. The problem has not changed since onset. The illness does not include chills, anorexia, dysphagia, odynophagia, bloating, constipation, tenesmus, back pain or itching. Significant associated medical issues include GERD. Associated medical issues do not include inflammatory bowel disease, gallstones, liver disease, alcohol abuse, PUD, gastric bypass, bowel resection, irritable bowel syndrome, hemorrhoids or diverticulitis.   Immunizations: (TDAP, Hep C screen, Pneumovax, Influenza, zoster)  Health Maintenance  Topic Date Due  . Tetanus Vaccine  08/06/2010  . Pap Smear  08/06/2012  . Flu Shot  01/17/2018  . HIV Screening  Completed   Diet:heart healthy.  Weight:  Wt Readings from Last 3 Encounters:  10/02/17 177 lb 3.2 oz (80.4 kg)  09/11/17 172 lb 3.2 oz (78.1 kg)  07/19/16 170 lb (77.1 kg)    Exercise:intermittently.  Fall Risk: Fall Risk  09/11/2017  Falls in the past year? No   Home Safety:home with husband.  Depression/Suicide: Depression screen [place] 2/9 09/11/2017  Decreased Interest 0  Down, Depressed, Hopeless 0  PHQ - 2 Score 0   Pap Smear (every 98yrs for >21-29 without HPV, every 62yrs for >30-58yrs with HPV):needed.  Vision:up to date.  Dental:up to date.  Advanced Directive: Advanced Directives 03/23/2017  Does Patient Have a Medical Advance Directive? No  Would patient like information on creating a medical advance directive? -   Medications and allergies reviewed with patient and updated if appropriate.  There are no active  problems to display for this patient.   Current Outpatient Medications on File Prior to Visit  Medication Sig Dispense Refill  . BIOTIN PO Take 1 tablet by mouth daily.    . Multiple Vitamins-Minerals (MULTIVITAMIN ADULT) TABS Take 1 tablet by mouth daily.     No current facility-administered medications on file prior to visit.     History reviewed. No pertinent past medical history.  History reviewed. No pertinent surgical history.  Social History   Socioeconomic History  . Marital status: Married    Spouse name: Not on file  . Number of children: Not on file  . Years of education: Not on file  . Highest education level: Not on file  Occupational History  . Not on file  Social Needs  . Financial resource strain: Not on file  . Food insecurity:    Worry: Not on file    Inability: Not on file  . Transportation needs:    Medical: Not on file    Non-medical: Not on file  Tobacco Use  . Smoking status: Never Smoker  . Smokeless tobacco: Never Used  Substance and Sexual Activity  . Alcohol use: Yes    Comment: rarely  . Drug use: No  . Sexual activity: Yes    Birth control/protection: None  Lifestyle  . Physical activity:    Days per week: Not on file    Minutes per session: Not on file  . Stress: Not on file  Relationships  . Social connections:    Talks on phone: Not on file    Gets together: Not on file    Attends religious service: Not on file  Active member of club or organization: Not on file    Attends meetings of clubs or organizations: Not on file    Relationship status: Not on file  Other Topics Concern  . Not on file  Social History Narrative  . Not on file    Family History  Problem Relation Age of Onset  . Diabetes Father   . Diabetes Paternal Grandmother   . Diabetes Paternal Grandfather         Review of Systems  Constitutional: Negative for chills, fatigue, fever, malaise/fatigue and weight loss.  HENT: Negative for congestion and  sore throat.   Eyes:       Negative for visual changes  Respiratory: Negative for cough and shortness of breath.   Cardiovascular: Negative for chest pain, palpitations and leg swelling.  Gastrointestinal: Positive for nausea. Negative for abdominal pain, anorexia, bloating, blood in stool, constipation, diarrhea, dysphagia, heartburn, hematemesis, hematochezia, jaundice, melena and vomiting.  Genitourinary: Negative for dysuria, frequency and urgency.  Musculoskeletal: Negative for arthralgias, back pain, falls, joint pain and myalgias.  Skin: Negative for itching and rash.  Neurological: Negative for dizziness, sensory change and headaches.  Endo/Heme/Allergies: Does not bruise/bleed easily.  Psychiatric/Behavioral: Negative for depression, substance abuse and suicidal ideas. The patient is not nervous/anxious.     Objective:   Vitals:   10/02/17 0912  BP: 118/80  Pulse: 66  Temp: 98.6 F (37 C)  SpO2: 97%    Body mass index is 29.49 kg/m.   Physical Examination:  Physical Exam  Constitutional: She is oriented to person, place, and time. No distress.  HENT:  Right Ear: External ear normal.  Left Ear: External ear normal.  Nose: Nose normal.  Mouth/Throat: No oropharyngeal exudate.  Eyes: Pupils are equal, round, and reactive to light. Conjunctivae and EOM are normal. No scleral icterus.  Neck: Normal range of motion. Neck supple. No thyromegaly present.  Cardiovascular: Normal rate, regular rhythm, normal heart sounds and intact distal pulses.  Pulmonary/Chest: Effort normal and breath sounds normal. Right breast exhibits no inverted nipple, no mass, no nipple discharge and no skin change. Left breast exhibits no inverted nipple, no mass, no nipple discharge and no skin change. Breasts are symmetrical.  Abdominal: Soft. Bowel sounds are normal. She exhibits no distension. There is no tenderness.  Genitourinary: Rectum normal. Rectal exam shows no external hemorrhoid.  Uterus is not enlarged and not tender. Cervix exhibits discharge. Cervix exhibits no motion tenderness and no friability. Vaginal discharge found.  Genitourinary Comments: Thin white vaginal discharge  Musculoskeletal: Normal range of motion. She exhibits no edema or tenderness.  Lymphadenopathy:    She has no cervical adenopathy.  Neurological: She is alert and oriented to person, place, and time.  Skin: Skin is warm and dry.  Psychiatric: She has a normal mood and affect. Her behavior is normal. Judgment and thought content normal.  Vitals reviewed.   ASSESSMENT and PLAN:  Lupe CarneyDaisha was seen today for annual exam.  Diagnoses and all orders for this visit:  Encounter for preventative adult health care examination -     Cancel: Cytology - PAP  Encounter for Papanicolaou smear for cervical cancer screening -     Cancel: Cytology - PAP -     Cytology - PAP  Vaginal discharge -     Cytology - PAP  Need for Tdap vaccination -     Tdap vaccine greater than or equal to 7yo IM   No problem-specific Assessment & Plan notes found for  this encounter.     Follow up: Return if symptoms worsen or fail to improve.  Alysia Penna, NP

## 2017-10-03 LAB — CYTOLOGY - PAP
Bacterial vaginitis: POSITIVE — AB
CANDIDA VAGINITIS: POSITIVE — AB
CHLAMYDIA, DNA PROBE: NEGATIVE
DIAGNOSIS: NEGATIVE
NEISSERIA GONORRHEA: NEGATIVE
Trichomonas: NEGATIVE

## 2017-10-04 MED ORDER — FLUCONAZOLE 150 MG PO TABS: 150.0000 mg | ORAL_TABLET | Freq: Every day | ORAL | 0 refills | Status: AC

## 2017-10-04 MED ORDER — METRONIDAZOLE 500 MG PO TABS: 500.0000 mg | ORAL_TABLET | Freq: Two times a day (BID) | ORAL | 0 refills | Status: AC

## 2017-10-04 NOTE — Addendum Note (Signed)
Addended by: Alysia PennaNCHE, Sreya Froio L on: 10/04/2017 08:11 AM   Modules accepted: Orders

## 2018-08-03 IMAGING — CR DG CHEST 2V
2 series · 2 of 2 positions shown · non-contrast
Comparison: None.

CLINICAL DATA: Burning chest pain for the past week.

EXAM:
CHEST  2 VIEW

[w chest pa]
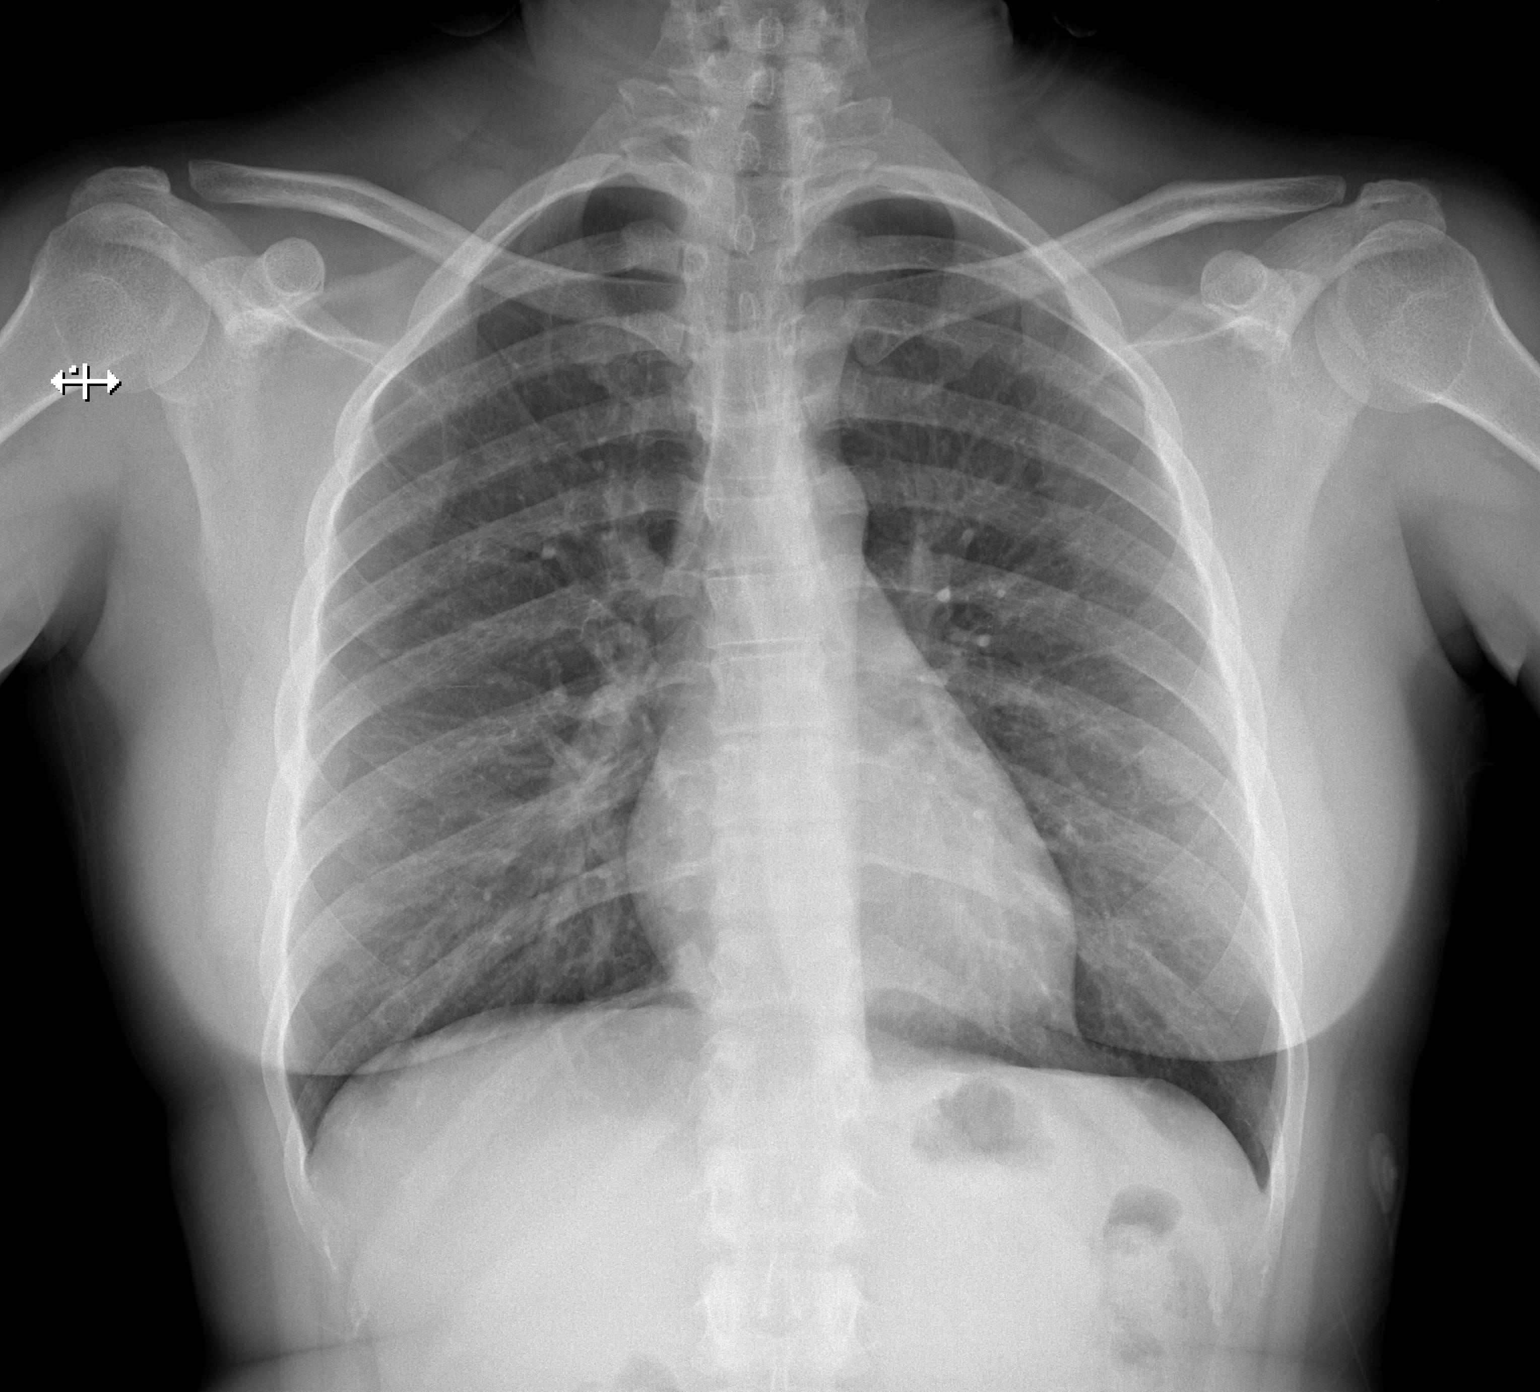

[w chest lat]
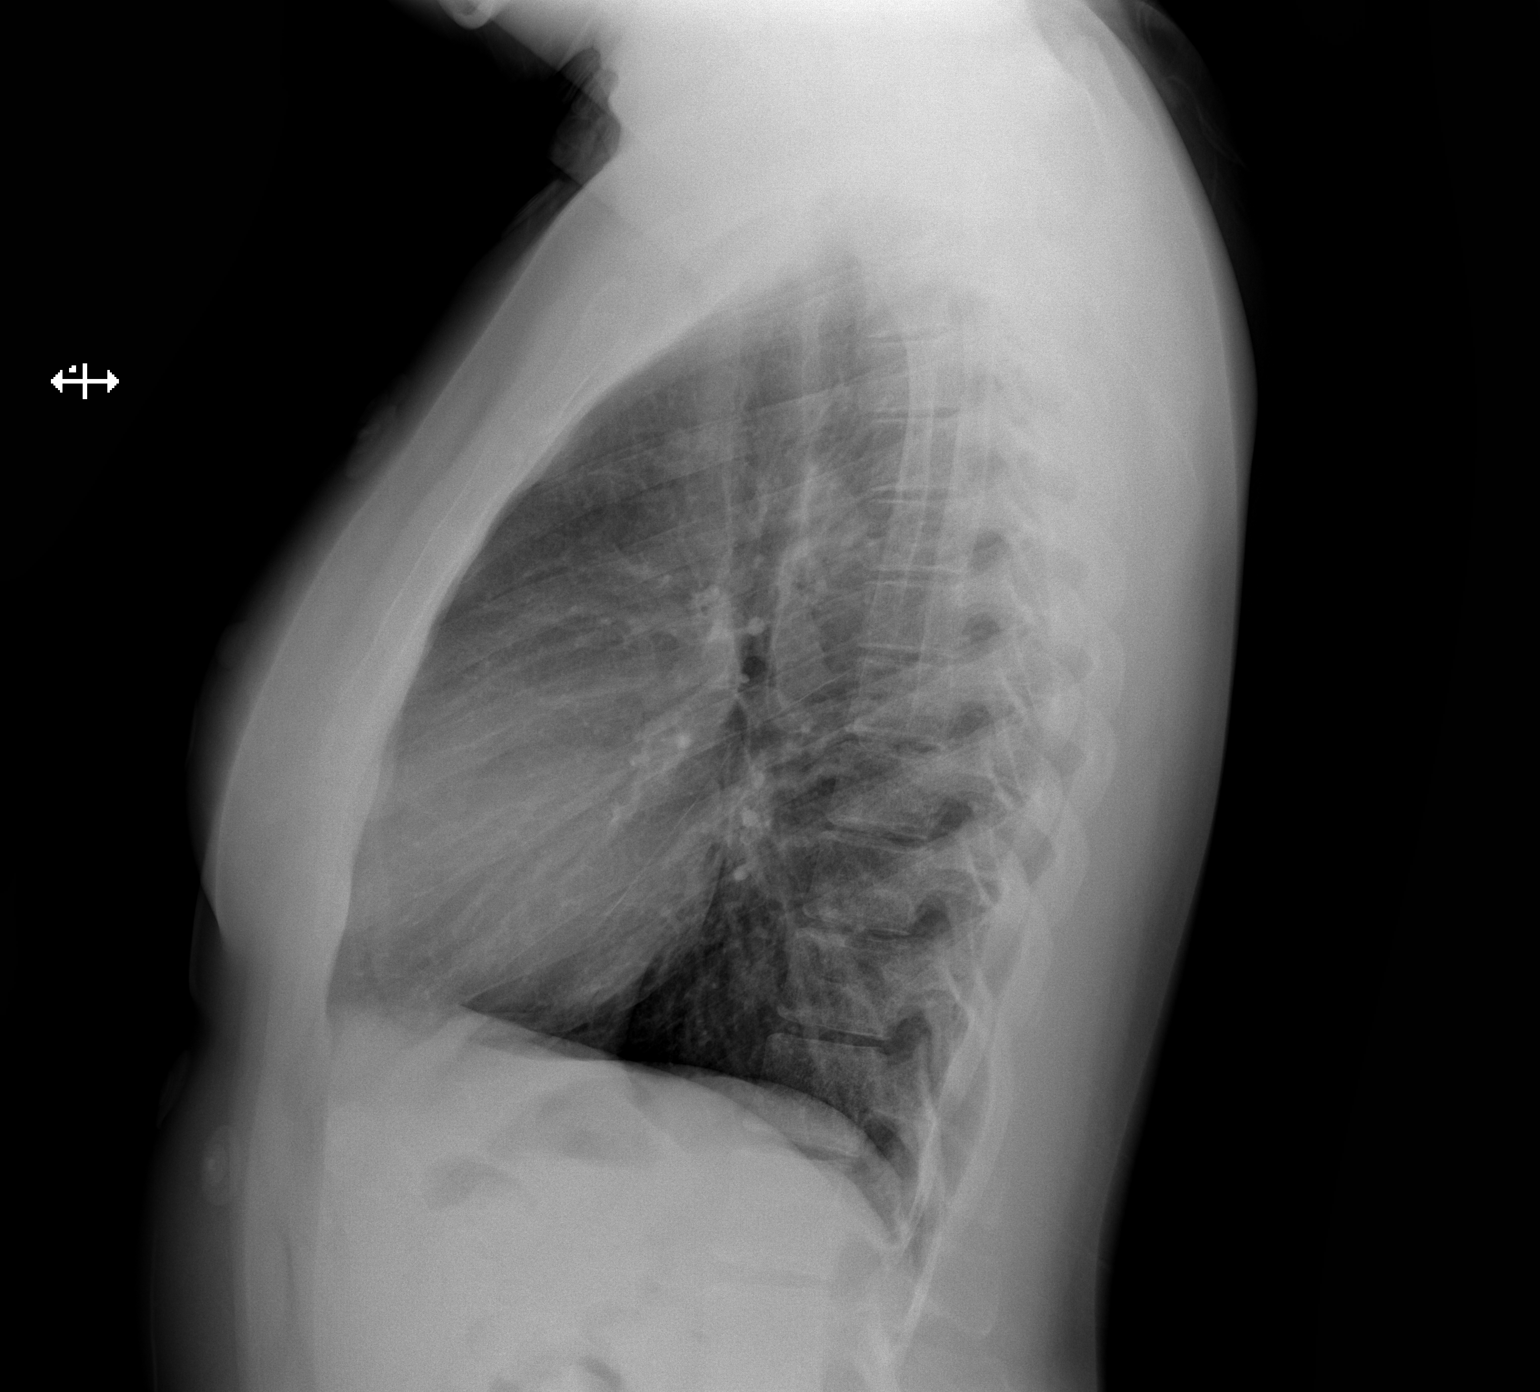

[2 of 2 positions shown; findings below may reference images not displayed]

FINDINGS: The heart size and mediastinal contours are within normal limits.
Both lungs are clear. The visualized skeletal structures are
unremarkable.
IMPRESSION: Normal chest x-ray.

## 2018-08-05 DIAGNOSIS — Z3202 Encounter for pregnancy test, result negative: Secondary | ICD-10-CM | POA: Diagnosis not present

## 2018-08-05 DIAGNOSIS — N898 Other specified noninflammatory disorders of vagina: Secondary | ICD-10-CM | POA: Diagnosis not present

## 2018-08-05 DIAGNOSIS — N926 Irregular menstruation, unspecified: Secondary | ICD-10-CM | POA: Diagnosis not present

## 2018-08-15 DIAGNOSIS — L810 Postinflammatory hyperpigmentation: Secondary | ICD-10-CM | POA: Diagnosis not present

## 2018-08-15 DIAGNOSIS — L709 Acne, unspecified: Secondary | ICD-10-CM | POA: Diagnosis not present

## 2019-02-06 DIAGNOSIS — H6123 Impacted cerumen, bilateral: Secondary | ICD-10-CM | POA: Diagnosis not present

## 2019-08-05 DIAGNOSIS — Z113 Encounter for screening for infections with a predominantly sexual mode of transmission: Secondary | ICD-10-CM | POA: Diagnosis not present

## 2019-08-05 DIAGNOSIS — Z01419 Encounter for gynecological examination (general) (routine) without abnormal findings: Secondary | ICD-10-CM | POA: Diagnosis not present

## 2019-08-11 DIAGNOSIS — F438 Other reactions to severe stress: Secondary | ICD-10-CM | POA: Diagnosis not present

## 2019-09-17 DIAGNOSIS — H6123 Impacted cerumen, bilateral: Secondary | ICD-10-CM | POA: Diagnosis not present

## 2019-11-20 DIAGNOSIS — R102 Pelvic and perineal pain: Secondary | ICD-10-CM | POA: Diagnosis not present

## 2019-11-20 DIAGNOSIS — N926 Irregular menstruation, unspecified: Secondary | ICD-10-CM | POA: Diagnosis not present

## 2019-12-15 DIAGNOSIS — F438 Other reactions to severe stress: Secondary | ICD-10-CM | POA: Diagnosis not present

## 2020-01-05 DIAGNOSIS — O039 Complete or unspecified spontaneous abortion without complication: Secondary | ICD-10-CM | POA: Diagnosis not present

## 2020-01-12 DIAGNOSIS — F438 Other reactions to severe stress: Secondary | ICD-10-CM | POA: Diagnosis not present

## 2020-01-13 DIAGNOSIS — O039 Complete or unspecified spontaneous abortion without complication: Secondary | ICD-10-CM | POA: Diagnosis not present
